# Patient Record
Sex: Male | Born: 1945 | Race: White | Hispanic: No | Marital: Married | Smoking: Former smoker
Health system: Southern US, Community
[De-identification: ages and names within clinical notes are randomized; demographics above are authoritative.]

## PROBLEM LIST (undated history)

## (undated) DIAGNOSIS — C801 Malignant (primary) neoplasm, unspecified: Secondary | ICD-10-CM

## (undated) DIAGNOSIS — M1990 Unspecified osteoarthritis, unspecified site: Secondary | ICD-10-CM

## (undated) DIAGNOSIS — K635 Polyp of colon: Secondary | ICD-10-CM

## (undated) DIAGNOSIS — I10 Essential (primary) hypertension: Secondary | ICD-10-CM

## (undated) DIAGNOSIS — I219 Acute myocardial infarction, unspecified: Secondary | ICD-10-CM

## (undated) HISTORY — PX: KNEE ARTHROSCOPY: SUR90

## (undated) HISTORY — PX: TONSILLECTOMY: SUR1361

## (undated) HISTORY — PX: MANDIBLE SURGERY: SHX707

## (undated) HISTORY — PX: HERNIA REPAIR: SHX51

---

## 2008-06-25 DIAGNOSIS — I219 Acute myocardial infarction, unspecified: Secondary | ICD-10-CM

## 2008-06-25 HISTORY — DX: Acute myocardial infarction, unspecified: I21.9

## 2015-04-13 HISTORY — PX: COLONOSCOPY: SHX174

## 2017-10-27 ENCOUNTER — Encounter (INDEPENDENT_AMBULATORY_CARE_PROVIDER_SITE_OTHER): Payer: Self-pay | Admitting: Orthopaedic Surgery

## 2017-10-27 ENCOUNTER — Ambulatory Visit (INDEPENDENT_AMBULATORY_CARE_PROVIDER_SITE_OTHER): Payer: Self-pay

## 2017-10-27 ENCOUNTER — Ambulatory Visit (INDEPENDENT_AMBULATORY_CARE_PROVIDER_SITE_OTHER): Payer: Medicare HMO | Admitting: Orthopaedic Surgery

## 2017-10-27 VITALS — Ht 68.0 in | Wt 190.0 lb

## 2017-10-27 DIAGNOSIS — M1612 Unilateral primary osteoarthritis, left hip: Secondary | ICD-10-CM

## 2017-10-27 DIAGNOSIS — M25552 Pain in left hip: Secondary | ICD-10-CM | POA: Diagnosis not present

## 2017-10-27 DIAGNOSIS — M1611 Unilateral primary osteoarthritis, right hip: Secondary | ICD-10-CM

## 2017-10-27 NOTE — Progress Notes (Signed)
Office Visit Note   Patient: Luis Elliott           Date of Birth: 1946/01/23           MRN: [id number] Visit Date: 10/27/2017              Requested by: No referring provider defined for this encounter. PCP: Thomes Dinning, MD   Assessment & Plan: Visit Diagnoses:  1. Pain in left hip   2. Unilateral primary osteoarthritis, right hip   3. Unilateral primary osteoarthritis, left hip     Plan: I do feel that based on his clinical exam and x-ray findings that he is a candidate for hip replacement surgery.  I gave him a handout that talks about anterior hip replacement surgery and I went over his x-rays on a hip model explained in detail what the surgery involves including a thorough discussion of the risks and benefits of surgery.  We talked about his intraoperative and postoperative course as well.  I do feel that he should try an intra-articular steroid injection of the left hip under direct fluoroscopy.  We can have Dr. Ernestina Patches provide this here in the office.  The patient is interested in having that done.  We will work on getting this scheduled in the now see him back sometime the first week in September to talk about things further.  All question concerns were answered and addressed.  Follow-Up Instructions: Return in about 2 months (around 12/28/2017).   Orders:  Orders Placed This Encounter  Procedures  . XR HIP UNILAT W OR W/O PELVIS 2-3 VIEWS LEFT  . Ambulatory referral to Physical Medicine Rehab   No orders of the defined types were placed in this encounter.     Procedures: No procedures performed   Clinical Data: No additional findings.   Subjective: Chief Complaint  Patient presents with  . Left Hip - Pain  The patient is a very pleasant and active 72 year old gentleman from [place] who comes in for evaluation treatment of left hip pain.  His primary orthopedic surgeon is retiring and has been told in the past that his hip is been  slowly worsening in terms of arthritis.  It is gotten to where his left hip stays stiff.  He has problems getting his shoe and sock on and off as well as crossing his leg.  He does get groin pain as well.  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  He wants to stay as active as possible.  He has stiffness in his right hip but denies any significant pain in the right hip.  Is mainly his left hip that bothers him the most.  His wife is with him today as well.  He has not had any type of intra-articular injection before that hip.  It has started detrimentally affect his activities daily living, his quality of life, his mobility.  He does not want to consider hip replacement surgery at some point.  HPI  Review of Systems He currently denies any headache, chest pain, shortness of breath, fever, chills, nausea, vomiting.  Objective: Vital Signs: Ht 5\' 8"  (1.727 m)   Wt 190 lb (86.2 kg)   BMI 28.89 kg/m   Physical Exam He is alert and oriented x3 and in no acute distress Ortho Exam Examination of his left hip shows significant limitations with internal and external rotation and significant stiffness.  He does have significant stiffness with rotation on the  right hip as well.  The left hip does show pain in the groin on rotation. Specialty Comments:  No specialty comments available.  Imaging: Xr Hip Unilat W Or W/o Pelvis 2-3 Views Left  Result Date: 10/27/2017 An AP pelvis and a lateral left hip shows significant arthritis of both hips.  There is profound superior lateral joint space narrowing of both hips as well as para-articular osteophytes both hips shows signs of femoral acetabular impingement.Marland Kitchen    PMFS History: Patient Active Problem List   Diagnosis Date Noted  . Unilateral primary osteoarthritis, left hip 10/27/2017  . Unilateral primary osteoarthritis, right hip 10/27/2017  . Pain in left hip 10/27/2017   History reviewed. No pertinent past medical history.  History  reviewed. No pertinent family history.  History reviewed. No pertinent surgical history. Social History   Occupational History  . Not on file  Tobacco Use  . Smoking status: Not on file  Substance and Sexual Activity  . Alcohol use: Not on file  . Drug use: Not on file  . Sexual activity: Not on file

## 2017-11-17 ENCOUNTER — Ambulatory Visit (INDEPENDENT_AMBULATORY_CARE_PROVIDER_SITE_OTHER): Payer: Self-pay | Admitting: Physical Medicine and Rehabilitation

## 2017-11-22 ENCOUNTER — Ambulatory Visit (INDEPENDENT_AMBULATORY_CARE_PROVIDER_SITE_OTHER): Payer: Medicare HMO | Admitting: Physical Medicine and Rehabilitation

## 2017-11-22 ENCOUNTER — Ambulatory Visit (INDEPENDENT_AMBULATORY_CARE_PROVIDER_SITE_OTHER): Payer: Self-pay

## 2017-11-22 DIAGNOSIS — M25552 Pain in left hip: Secondary | ICD-10-CM | POA: Diagnosis not present

## 2017-11-22 NOTE — Patient Instructions (Signed)

## 2017-11-22 NOTE — Progress Notes (Signed)
 .  Numeric Pain Rating Scale and Functional Assessment Average Pain 6   In the last MONTH (on 0-10 scale) has pain interfered with the following?  1. General activity like being  able to carry out your everyday physical activities such as walking, climbing stairs, carrying groceries, or moving a chair?  Rating(6)   -Dye Allergies.  

## 2017-11-22 NOTE — Progress Notes (Signed)
Luis Elliott - 72 y.o. male MRN [id number]  Date of birth: 06-11-1945  Office Visit Note: Visit Date: 11/22/2017 PCP: Thomes Dinning, MD Referred by: Thomes Dinning, MD  Subjective: Chief Complaint  Patient presents with  . Left Hip - Pain   HPI: Luis Elliott is a 72 year old gentleman that comes in today at the request of Dr. Jean Rosenthal for diagnostic and hopefully therapeutic left hip anesthetic arthrogram.  He is a very active individual playing tennis as well as pickleball.  He does get some hip and groin pain which was worse recently but has calmed down a little bit but he still has difficulty bending over to put his shoes on and some activities where he is stiff with loss of range of motion.  His wife is also present today who provides some of the history.  They discussed with me the fact that he had bilateral shoulder injections last week and we did talk about the complications with steroid medications but it would be safe to do the injection today we just would not want to do many of those back to back.  They also discussed the fact that he has had bilateral shoulder injections by his primary orthopedic doctor and they usually do these injections from an anterior approach and the last doctor that did it did it more from a posterior approach or what might of been a lateral approach.  He has not received as much relief from those injections although he is only a week out.  It sounds like it could have been more of a subacromial injection instead of intra-articular on off that would make a lot of difference and we discussed that briefly.  He did have some issue after the injection on the right arm where he was having difficulty with what appeared to be internal rotation and some temporary weakness of the shoulder.  This is totally resolved and they are following up with their regular orthopedic surgeon in terms of the shoulders.   ROS Otherwise per HPI.  Assessment &  Plan: Visit Diagnoses:  1. Pain in left hip     Plan: No additional findings.   Meds & Orders: No orders of the defined types were placed in this encounter.   Orders Placed This Encounter  Procedures  . Large Joint Inj: L hip joint  . XR C-ARM NO REPORT    Follow-up: Return if symptoms worsen or fail to improve.   Procedures: Large Joint Inj: L hip joint on 11/22/2017 11:30 AM Indications: pain and diagnostic evaluation Details: 22 G needle, anterior approach  Arthrogram: Yes  Medications: 80 mg triamcinolone acetonide 40 MG/ML; 3 mL bupivacaine 0.5 % Outcome: tolerated well, no immediate complications  Arthrogram demonstrated excellent flow of contrast throughout the joint surface without extravasation or obvious defect.  The patient had relief of symptoms during the anesthetic phase of the injection.  Procedure, treatment alternatives, risks and benefits explained, specific risks discussed. Consent was given by the patient. Immediately prior to procedure a time out was called to verify the correct patient, procedure, equipment, support staff and site/side marked as required. Patient was prepped and draped in the usual sterile fashion.      No notes on file   Clinical History: No specialty comments available.   He has no tobacco history on file. No results for input(s): HGBA1C, LABURIC in the last 8760 hours.  Objective:  VS:  HT:    WT:   BMI:     BP:  HR: bpm  TEMP: ( )  RESP:  Physical Exam  Ortho Exam Imaging: Xr C-arm No Report  Result Date: 11/22/2017 Please see Notes tab for imaging impression.   Past Medical/Family/Surgical/Social History: Medications & Allergies reviewed per EMR, new medications updated. Patient Active Problem List   Diagnosis Date Noted  . Unilateral primary osteoarthritis, left hip 10/27/2017  . Unilateral primary osteoarthritis, right hip 10/27/2017  . Pain in left hip 10/27/2017   No past medical history on file. No family  history on file. No past surgical history on file. Social History   Occupational History  . Not on file  Tobacco Use  . Smoking status: Not on file  Substance and Sexual Activity  . Alcohol use: Not on file  . Drug use: Not on file  . Sexual activity: Not on file

## 2017-11-23 MED ORDER — BUPIVACAINE HCL 0.5 % IJ SOLN
3.0000 mL | INTRAMUSCULAR | Status: AC | PRN
  Administered 2017-11-22: 3 mL via INTRA_ARTICULAR

## 2017-11-23 MED ORDER — TRIAMCINOLONE ACETONIDE 40 MG/ML IJ SUSP
80.0000 mg | INTRAMUSCULAR | Status: AC | PRN
  Administered 2017-11-22: 80 mg via INTRA_ARTICULAR

## 2017-12-13 ENCOUNTER — Ambulatory Visit (INDEPENDENT_AMBULATORY_CARE_PROVIDER_SITE_OTHER): Payer: Medicare HMO | Admitting: Orthopaedic Surgery

## 2017-12-13 ENCOUNTER — Encounter (INDEPENDENT_AMBULATORY_CARE_PROVIDER_SITE_OTHER): Payer: Self-pay | Admitting: Orthopaedic Surgery

## 2017-12-13 DIAGNOSIS — M1612 Unilateral primary osteoarthritis, left hip: Secondary | ICD-10-CM | POA: Diagnosis not present

## 2017-12-13 NOTE — Progress Notes (Addendum)
  HPI: Mr. Juenger returns today 1 month follow-up status post left hip injection 11/22/2017.  He states overall her left hip feels much better he is able to don shoes.  States that the injection took about 24 hours to work and since then return back to his normal activities.  He is having no pain in his right hip which he has osteoarthritis and also.  Physical exam: Left hip good range of motion without pain.  He is able to cross his legs.  He ambulates without any assistive device and a nonantalgic gait.  Impression: Left hip osteoarthritis  Plan: He understands the recommend cortisone injections in the hip no more frequent than every 6 months.  He will call if he needs an injection again in the hip with Dr. Ernestina Patches.  He will follow-up with [place] on an as-needed basis if his pain becomes nonresponsive to the injection then we do recommend total hip arthroplasty.  He may require an injection in the right hip also in the future.  Follow-up on as-needed basis.

## 2017-12-20 ENCOUNTER — Telehealth (INDEPENDENT_AMBULATORY_CARE_PROVIDER_SITE_OTHER): Payer: Self-pay | Admitting: Physical Medicine and Rehabilitation

## 2017-12-20 NOTE — Telephone Encounter (Signed)
See message below  Please advise

## 2017-12-20 NOTE — Telephone Encounter (Signed)
Refer question to [facility] and see what they say

## 2017-12-21 NOTE — Telephone Encounter (Signed)
No more often than  Every 6 months per Dr. Ninfa Linden .needs consider surgery if pain has returned this quickly.

## 2017-12-21 NOTE — Telephone Encounter (Signed)
Called patient to advise  °

## 2018-01-23 ENCOUNTER — Ambulatory Visit (INDEPENDENT_AMBULATORY_CARE_PROVIDER_SITE_OTHER): Payer: Medicare HMO | Admitting: Orthopaedic Surgery

## 2018-01-23 ENCOUNTER — Encounter (INDEPENDENT_AMBULATORY_CARE_PROVIDER_SITE_OTHER): Payer: Self-pay | Admitting: Orthopaedic Surgery

## 2018-01-23 DIAGNOSIS — M1612 Unilateral primary osteoarthritis, left hip: Secondary | ICD-10-CM

## 2018-01-23 NOTE — Progress Notes (Signed)
Patient is a very pleasant 72 year old gentleman who is scheduled to undergo a left total hip arthroplasty November 8.  He has had intra-articular steroid injection we did help some.  He is also had a Toradol injection in his primary care physician did place a steroid injection intramuscularly.  That is helped some.  He is ready to have the surgery.  Again these are scheduled for this we had a long and thorough discussion about the surgery involves.  He is had a handout about hip replacement surgery and went over hip model showing him things as well.  We had a long and thorough discussion about his intraoperative and postoperative course.  He is having surgery on the scalp on Wednesday of this week due to skin cancer.  We still feel comfortable with proceeding with his hip replacement on November 8.  His left hip is quite stiff on exam is well with pain on internal/external rotation.  I did look at his x-rays again showing the significance of his arthritis of his left hip.  All questions concerns were answered and addressed.  We will see him on November 8 for his surgery.

## 2018-01-26 ENCOUNTER — Telehealth (INDEPENDENT_AMBULATORY_CARE_PROVIDER_SITE_OTHER): Payer: Self-pay | Admitting: Orthopaedic Surgery

## 2018-01-26 NOTE — Telephone Encounter (Signed)
Patient called stating that he has surgery scheduled for 02/17/18 and needs to postpone surgery oer his MD Dr. Levada Dy. Patient had skin surgery and it ended up being more extensive as planned.  Patient wants a call back from scheduler so he can possibly reschedule.  Please call patient to advise. [phone]

## 2018-01-30 NOTE — Telephone Encounter (Signed)
Spoke with pt, will cancel until he calls back to reschedule once cleared from other surgeon.

## 2018-03-02 ENCOUNTER — Inpatient Hospital Stay (INDEPENDENT_AMBULATORY_CARE_PROVIDER_SITE_OTHER): Payer: Medicare HMO | Admitting: Orthopaedic Surgery

## 2018-04-19 NOTE — Progress Notes (Signed)
Please place orders in Epic as patient is being scheduled for a pre-op appointment! Thank you! 

## 2018-04-20 ENCOUNTER — Other Ambulatory Visit (INDEPENDENT_AMBULATORY_CARE_PROVIDER_SITE_OTHER): Payer: Self-pay

## 2018-04-24 NOTE — Patient Instructions (Signed)
Luis Elliott.  04/24/2018   Your procedure is scheduled on: 05-05-18    Report to [facility] Main  Entrance    Report to Admitting at 5:30 AM    Call this number if you have problems the morning of surgery [phone]    Remember: Do not eat food or drink liquids :After Midnight.      Take these medicines the morning of surgery with A SIP OF WATER:Carvedilol (Coreg)    BRUSH YOUR TEETH MORNING OF SURGERY AND RINSE YOUR MOUTH OUT, NO CHEWING GUM CANDY OR MINTS.                               You may not have any metal on your body including hair pins and              piercings  Do not wear jewelry, cologne, lotions, powders or deodorant             Men may shave face and neck.   Do not bring valuables to the hospital. [place].  Contacts, dentures or bridgework may not be worn into surgery.  Leave suitcase in the car. After surgery it may be brought to your room.     Patients discharged the day of surgery will not be allowed to drive home. IF YOU ARE HAVING SURGERY AND GOING HOME THE SAME DAY, YOU MUST HAVE AN ADULT TO DRIVE YOU HOME AND BE WITH YOU FOR 24 HOURS. YOU MAY GO HOME BY TAXI OR UBER OR ORTHERWISE, BUT AN ADULT MUST ACCOMPANY YOU HOME AND STAY WITH YOU FOR 24 HOURS.    Special Instructions: N/A              Please read over the following fact sheets you were given: _____________________________________________________________________             [place] - Preparing for Surgery Before surgery, you can play an important role.  Because skin is not sterile, your skin needs to be as free of germs as possible.  You can reduce the number of germs on your skin by washing with CHG (chlorahexidine gluconate) soap before surgery.  CHG is an antiseptic cleaner which kills germs and bonds with the skin to continue killing germs even after washing. Please DO NOT use if you have an allergy  to CHG or antibacterial soaps.  If your skin becomes reddened/irritated stop using the CHG and inform your nurse when you arrive at Short Stay. Do not shave (including legs and underarms) for at least 48 hours prior to the first CHG shower.  You may shave your face/neck. Please follow these instructions carefully:  1.  Shower with CHG Soap the night before surgery and the  morning of Surgery.  2.  If you choose to wash your hair, wash your hair first as usual with your  normal  shampoo.  3.  After you shampoo, rinse your hair and body thoroughly to remove the  shampoo.                           4.  Use CHG as you would any other liquid soap.  You can apply chg directly  to the skin and wash                       Gently with a scrungie or clean washcloth.  5.  Apply the CHG Soap to your body ONLY FROM THE NECK DOWN.   Do not use on face/ open                           Wound or open sores. Avoid contact with eyes, ears mouth and genitals (private parts).                       Wash face,  Genitals (private parts) with your normal soap.             6.  Wash thoroughly, paying special attention to the area where your surgery  will be performed.  7.  Thoroughly rinse your body with warm water from the neck down.  8.  DO NOT shower/wash with your normal soap after using and rinsing off  the CHG Soap.                9.  Pat yourself dry with a clean towel.            10.  Wear clean pajamas.            11.  Place clean sheets on your bed the night of your first shower and do not  sleep with pets. Day of Surgery : Do not apply any lotions/deodorants the morning of surgery.  Please wear clean clothes to the hospital/surgery center.  FAILURE TO FOLLOW THESE INSTRUCTIONS MAY RESULT IN THE CANCELLATION OF YOUR SURGERY PATIENT SIGNATURE_________________________________  NURSE SIGNATURE__________________________________  ________________________________________________________________________   Luis Elliott  An incentive spirometer is a tool that can help keep your lungs clear and active. This tool measures how well you are filling your lungs with each breath. Taking long deep breaths may help reverse or decrease the chance of developing breathing (pulmonary) problems (especially infection) following:  A long period of time when you are unable to move or be active. BEFORE THE PROCEDURE   If the spirometer includes an indicator to show your best effort, your nurse or respiratory therapist will set it to a desired goal.  If possible, sit up straight or lean slightly forward. Try not to slouch.  Hold the incentive spirometer in an upright position. INSTRUCTIONS FOR USE  1. Sit on the edge of your bed if possible, or sit up as far as you can in bed or on a chair. 2. Hold the incentive spirometer in an upright position. 3. Breathe out normally. 4. Place the mouthpiece in your mouth and seal your lips tightly around it. 5. Breathe in slowly and as deeply as possible, raising the piston or the ball toward the top of the column. 6. Hold your breath for 3-5 seconds or for as long as possible. Allow the piston or ball to fall to the bottom of the column. 7. Remove the mouthpiece from your mouth and breathe out normally. 8. Rest for a few seconds and repeat Steps 1 through 7 at least 10 times every 1-2 hours when you are awake. Take your time and take a few normal breaths between deep breaths. 9. The spirometer may include an indicator to show your best effort. Use the indicator as a goal to work toward during each repetition. 10. After each  set of 10 deep breaths, practice coughing to be sure your lungs are clear. If you have an incision (the cut made at the time of surgery), support your incision when coughing by placing a pillow or rolled up towels firmly against it. Once you are able to get out of bed, walk around indoors and cough well. You may stop using the incentive spirometer when  instructed by your caregiver.  RISKS AND COMPLICATIONS  Take your time so you do not get dizzy or light-headed.  If you are in pain, you may need to take or ask for pain medication before doing incentive spirometry. It is harder to take a deep breath if you are having pain. AFTER USE  Rest and breathe slowly and easily.  It can be helpful to keep track of a log of your progress. Your caregiver can provide you with a simple table to help with this. If you are using the spirometer at home, follow these instructions: [place] IF:   You are having difficultly using the spirometer.  You have trouble using the spirometer as often as instructed.  Your pain medication is not giving enough relief while using the spirometer.  You develop fever of 100.5 F (38.1 C) or higher. SEEK IMMEDIATE MEDICAL CARE IF:   You cough up bloody sputum that had not been present before.  You develop fever of 102 F (38.9 C) or greater.  You develop worsening pain at or near the incision site. MAKE SURE YOU:   Understand these instructions.  Will watch your condition.  Will get help right away if you are not doing well or get worse. Document Released: 08/09/2006 Document Revised: 06/21/2011 Document Reviewed: 10/10/2006 [facility] Patient Information 2014 [place], [place].   ________________________________________________________________________

## 2018-04-25 ENCOUNTER — Other Ambulatory Visit (INDEPENDENT_AMBULATORY_CARE_PROVIDER_SITE_OTHER): Payer: Self-pay | Admitting: Physician Assistant

## 2018-04-25 ENCOUNTER — Ambulatory Visit (INDEPENDENT_AMBULATORY_CARE_PROVIDER_SITE_OTHER): Payer: Medicare HMO | Admitting: Orthopaedic Surgery

## 2018-04-25 ENCOUNTER — Encounter (INDEPENDENT_AMBULATORY_CARE_PROVIDER_SITE_OTHER): Payer: Self-pay | Admitting: Orthopaedic Surgery

## 2018-04-25 DIAGNOSIS — M1611 Unilateral primary osteoarthritis, right hip: Secondary | ICD-10-CM | POA: Diagnosis not present

## 2018-04-25 DIAGNOSIS — M1612 Unilateral primary osteoarthritis, left hip: Secondary | ICD-10-CM

## 2018-04-25 NOTE — Progress Notes (Signed)
Office Visit Note   Patient: Luis Elliott.           Date of Birth: [birth year]           MRN: [id number] Visit Date: 04/25/2018              Requested by: Thomes Dinning, [address] [place], [place] [address] PCP: Thomes Dinning, MD   Assessment & Plan: Visit Diagnoses:  1. Unilateral primary osteoarthritis, left hip   2. Unilateral primary osteoarthritis, right hip     Plan: We went over again in detail what hip replacement surgery involves.  We had a long and thorough discussion about the risk and benefits of the surgery.  We talked about his intraoperative and postoperative course and I showed him a hip replacement model as well as real implants.  All questions concerns were answered and addressed.  I am comfortable with proceeding with the surgery given the healing that he has had with a skin cancer wound on the scalp.  We will see him in 2 weeks for his surgery.  Follow-Up Instructions: Return for 2 weeks post-op.   Orders:  No orders of the defined types were placed in this encounter.  No orders of the defined types were placed in this encounter.     Procedures: No procedures performed   Clinical Data: No additional findings.   Subjective: Chief Complaint  Patient presents with  . Left Hip - Follow-up  The patient is well-known to me.  He was originally scheduled for a left total hip arthroplasty in November of this past year but due to issues with getting skin cancer surgery to heal on his scalp, we decided to cancel the surgery appropriately so he get aggressive healing on his scalp.  Now that is taken place and he is doing better overall.  He is ready to proceed with a left total hip arthroplasty.  We have this scheduled for Friday, January 24.  He denies any other acute changes in medical status other than dealing with his scalp skin cancer.  He has Xeroform that is being treated to the wound daily and there is been no  evidence of infection.  We are comfortable with proceeding with his hip replacement at this standpoint.  He has known and well-documented significant arthritis of both his hips with the left worse than the right.  At this point his hip pain still detrimentally affects his activity living, his mobility and his quality of life.  HPI  Review of Systems He currently denies any headache, chest pain, shortness of breath, fever, chills, nausea, vomiting  Objective: Vital Signs: There were no vitals taken for this visit.  Physical Exam He is alert and oriented x3 and in no acute distress Ortho Exam Examination of his left hip shows significant stiffness with internal and external rotation and pain in the groin. Specialty Comments:  No specialty comments available.  Imaging: No results found. Previous x-rays were reviewed and show severe arthritis of both his hips with joint space narrowing, flattening of the femoral head and para-articular osteophytes with left worse than the right.  PMFS History: Patient Active Problem List   Diagnosis Date Noted  . Unilateral primary osteoarthritis, left hip 10/27/2017  . Unilateral primary osteoarthritis, right hip 10/27/2017  . Pain in left hip 10/27/2017   History reviewed. No pertinent past medical history.  History reviewed. No pertinent family history.  History reviewed. No pertinent surgical history. Social History  Occupational History  . Not on file  Tobacco Use  . Smoking status: Not on file  Substance and Sexual Activity  . Alcohol use: Not on file  . Drug use: Not on file  . Sexual activity: Not on file

## 2018-04-26 ENCOUNTER — Encounter (HOSPITAL_COMMUNITY): Payer: Self-pay

## 2018-04-26 ENCOUNTER — Encounter (HOSPITAL_COMMUNITY)
Admission: RE | Admit: 2018-04-26 | Discharge: 2018-04-26 | Disposition: A | Discharge status: Home / Self Care | Payer: Medicare HMO | Source: Ambulatory Visit | Attending: Orthopaedic Surgery | Admitting: Orthopaedic Surgery

## 2018-04-26 ENCOUNTER — Other Ambulatory Visit: Payer: Self-pay

## 2018-04-26 DIAGNOSIS — Z01818 Encounter for other preprocedural examination: Secondary | ICD-10-CM | POA: Diagnosis present

## 2018-04-26 HISTORY — DX: Unspecified osteoarthritis, unspecified site: M19.90

## 2018-04-26 HISTORY — DX: Acute myocardial infarction, unspecified: I21.9

## 2018-04-26 HISTORY — DX: Malignant (primary) neoplasm, unspecified: C80.1

## 2018-04-26 HISTORY — DX: Essential (primary) hypertension: I10

## 2018-04-26 LAB — SURGICAL PCR SCREEN
MRSA, PCR: NEGATIVE
STAPHYLOCOCCUS AUREUS: NEGATIVE

## 2018-04-26 LAB — BASIC METABOLIC PANEL
Anion gap: 10 (ref 5–15)
BUN: 17 mg/dL (ref 8–23)
CO2: 24 mmol/L (ref 22–32)
CREATININE: 0.94 mg/dL (ref 0.61–1.24)
Calcium: 9.4 mg/dL (ref 8.9–10.3)
Chloride: 105 mmol/L (ref 98–111)
GFR calc Af Amer: >60 mL/min (ref >60)
GFR calc non Af Amer: >60 mL/min (ref >60)
Glucose, Bld: 116 mg/dL — ABNORMAL HIGH (ref 70–99)
Potassium: 4.2 mmol/L (ref 3.5–5.1)
Sodium: 139 mmol/L (ref 135–145)

## 2018-04-26 LAB — CBC
HCT: 45.6 % (ref 39.0–52.0)
Hemoglobin: 15.1 g/dL (ref 13.0–17.0)
MCH: 31.1 pg (ref 26.0–34.0)
MCHC: 33.1 g/dL (ref 30.0–36.0)
MCV: 94 fL (ref 80.0–100.0)
Platelets: 181 10*3/uL (ref 150–400)
RBC: 4.85 MIL/uL (ref 4.22–5.81)
RDW: 11.9 % (ref 11.5–15.5)
WBC: 7.8 10*3/uL (ref 4.0–10.5)
nRBC: 0 % (ref 0.0–0.2)

## 2018-04-27 NOTE — Progress Notes (Signed)
Anesthesia Chart Review   Case:  [id number] Date/Time:  05/05/18 0700   Procedure:  LEFT TOTAL HIP ARTHROPLASTY ANTERIOR APPROACH (Left )   Anesthesia type:  Choice   Pre-op diagnosis:  osteoarthritis left hip   Location:  [facility] ROOM 09 / [facility]   Surgeon:  Mcarthur Rossetti, MD      DISCUSSION:72 yo former smoker (10 pack years, quit 08/10/76) with h/o CAD, MI in 2010 , HTN scheduled for above surgery on 05/05/18 with Dr. Ninfa Linden.    H/o MI in 2010 s/p LAD stenting. He is followed by Dr. Ricky Ala and was last seen 04/14/17.  Per Dr. Deatra Robinson note [facility]) pt has remained asymptomatic and active, plays tennis and other sports regularly.  He recommended 1 year follow up.    Last seen by PCP on 03/23/18, Dr. Thomes Dinning.  Per his note (Care Everywhere), "At present time patient is medically stable to under total hip replacement.  He has no symptoms or signs of angina or mycardial failure.  Conditions are well controlled and has a good functional status."    He recently underwent a MOHs surgery to scalp.  Surgery was previously cancelled to allow for the surgical wound to heal.  I was asked to examine his surgical site today during PST visit.  Wound appears to be healing well with only a small superficial scab noted, no signs of infections, no drainage, erythema, swelling, or warmth noted.  He reports he was seen by Dr. Ninfa Linden yesterday, 04/25/18 who also examined the wound and states in his not, "I am comfortable with proceeding with the surgery given the healing that he has had with a skin cancer wound on the scalp." He is ok to proceed with planned procedure and will contact Dr. Ninfa Linden if there are any changes to the wound.   Pt can proceed with planned procedure barring acute status change.  VS: BP 113/67   Pulse 71   Temp 36.9 C (Oral)   Resp 18   Ht 5\' 7"  (1.702 m)   Wt 84.1 kg   SpO2 95%   BMI 29.05 kg/m   PROVIDERS: Thomes Dinning, MD is PCP  Andrey Spearman, MD is Cardiologist  LABS: Labs reviewed: Acceptable for surgery. (all labs ordered are listed, but only abnormal results are displayed)  Labs Reviewed  BASIC METABOLIC PANEL - Abnormal; Notable for the following components:      Result Value   Glucose, Bld 116 (*)    All other components within normal limits  SURGICAL PCR SCREEN  CBC     IMAGES:   EKG: 04/26/2018 Normal sinus rhythm Left axis deviation  Abnormal ECG  CV: Ultrasound of Abdominal Aorta 12/03/16 FINDINGS: Abdominal Aorta No aneurysm identified.  Atherosclerotic irregularity noted. Maximum Diameter: Maximum diameter 2.7 cm in the proximal aorta. CONCLUSION: No evidence of abdominal aortic aneurysm. Atherosclerotic irregularity. Past Medical History:  Diagnosis Date  . Arthritis   . Cancer ([place])    Melanoma  . Hypertension   . Myocardial infarction ([place]) 06/25/2008    Past Surgical History:  Procedure Laterality Date  . COLONOSCOPY  04/2015   Pt has every 5 years  . HERNIA REPAIR    . KNEE ARTHROSCOPY     x 3 (Last surgery 2009)  . MANDIBLE SURGERY     Mole on area was removed  . TONSILLECTOMY      MEDICATIONS: . acetaminophen (TYLENOL) 325 MG tablet  . aspirin EC 81 MG tablet  .  carvedilol (COREG) 12.5 MG tablet  . docusate sodium (COLACE) 100 MG capsule  . losartan (COZAAR) 25 MG tablet  . rosuvastatin (CRESTOR) 10 MG tablet  . tamsulosin (FLOMAX) 0.4 MG CAPS capsule   No current facility-administered medications for this encounter.    Maia Plan [facility] [phone] 04/27/18 2:47 PM

## 2018-04-27 NOTE — Anesthesia Preprocedure Evaluation (Addendum)
Anesthesia Evaluation  Patient identified by MRN, date of birth, ID band Patient awake    Reviewed: Allergy & Precautions, NPO status , Patient's Chart, lab work & pertinent test results, reviewed documented beta blocker date and time   Airway Mallampati: II  TM Distance: >3 FB Neck ROM: Full    Dental no notable dental hx.    Pulmonary former smoker,    Pulmonary exam normal breath sounds clear to auscultation       Cardiovascular hypertension, Pt. on home beta blockers and Pt. on medications + CAD, + Past MI and + Cardiac Stents (x 3 in 2010)  Normal cardiovascular exam Rhythm:Regular Rate:Normal  ECG: rate 63. Normal sinus rhythm. Left axis deviation  Sees cardiologist    Neuro/Psych negative neurological ROS  negative psych ROS   GI/Hepatic negative GI ROS, Neg liver ROS,   Endo/Other  negative endocrine ROS  Renal/GU negative Renal ROS     Musculoskeletal  (+) Arthritis ,   Abdominal   Peds  Hematology HLD   Anesthesia Other Findings Osteoarthritis left hip  Reproductive/Obstetrics                           Anesthesia Physical Anesthesia Plan  ASA: III  Anesthesia Plan: Spinal   Post-op Pain Management:    Induction: Intravenous  PONV Risk Score and Plan: 1 and Propofol infusion, Treatment may vary due to age or medical condition and Dexamethasone  Airway Management Planned: Natural Airway  Additional Equipment:   Intra-op Plan:   Post-operative Plan:   Informed Consent: I have reviewed the patients History and Physical, chart, labs and discussed the procedure including the risks, benefits and alternatives for the proposed anesthesia with the patient or authorized representative who has indicated his/her understanding and acceptance.     Dental advisory given  Plan Discussed with: CRNA  Anesthesia Plan Comments: (Reviewed PST note 04/26/18, Konrad Felix,  PA-C Multi-modal: spinal and dexamethasone)      Anesthesia Quick Evaluation

## 2018-05-05 ENCOUNTER — Ambulatory Visit (HOSPITAL_COMMUNITY): Payer: Medicare HMO

## 2018-05-05 ENCOUNTER — Encounter (HOSPITAL_COMMUNITY)
Admission: RE | Disposition: A | Discharge status: Home / Self Care | Payer: Self-pay | Source: Home / Self Care | Attending: Orthopaedic Surgery

## 2018-05-05 ENCOUNTER — Observation Stay (HOSPITAL_COMMUNITY): Payer: Medicare HMO

## 2018-05-05 ENCOUNTER — Ambulatory Visit (HOSPITAL_COMMUNITY): Payer: Medicare HMO | Admitting: Physician Assistant

## 2018-05-05 ENCOUNTER — Other Ambulatory Visit: Payer: Self-pay

## 2018-05-05 ENCOUNTER — Encounter (HOSPITAL_COMMUNITY): Payer: Self-pay

## 2018-05-05 ENCOUNTER — Ambulatory Visit (HOSPITAL_COMMUNITY): Payer: Medicare HMO | Admitting: Certified Registered"

## 2018-05-05 ENCOUNTER — Observation Stay (HOSPITAL_COMMUNITY)
Admission: RE | Admit: 2018-05-05 | Discharge: 2018-05-06 | Disposition: A | Discharge status: Home / Self Care | Payer: Medicare HMO | Attending: Orthopaedic Surgery | Admitting: Orthopaedic Surgery

## 2018-05-05 DIAGNOSIS — Z888 Allergy status to other drugs, medicaments and biological substances status: Secondary | ICD-10-CM | POA: Diagnosis not present

## 2018-05-05 DIAGNOSIS — I252 Old myocardial infarction: Secondary | ICD-10-CM | POA: Insufficient documentation

## 2018-05-05 DIAGNOSIS — Z7982 Long term (current) use of aspirin: Secondary | ICD-10-CM | POA: Insufficient documentation

## 2018-05-05 DIAGNOSIS — Z87891 Personal history of nicotine dependence: Secondary | ICD-10-CM | POA: Insufficient documentation

## 2018-05-05 DIAGNOSIS — I2510 Atherosclerotic heart disease of native coronary artery without angina pectoris: Secondary | ICD-10-CM | POA: Insufficient documentation

## 2018-05-05 DIAGNOSIS — M1612 Unilateral primary osteoarthritis, left hip: Secondary | ICD-10-CM | POA: Diagnosis present

## 2018-05-05 DIAGNOSIS — I10 Essential (primary) hypertension: Secondary | ICD-10-CM | POA: Insufficient documentation

## 2018-05-05 DIAGNOSIS — Z85820 Personal history of malignant melanoma of skin: Secondary | ICD-10-CM | POA: Diagnosis not present

## 2018-05-05 DIAGNOSIS — E785 Hyperlipidemia, unspecified: Secondary | ICD-10-CM | POA: Diagnosis not present

## 2018-05-05 DIAGNOSIS — Z96642 Presence of left artificial hip joint: Secondary | ICD-10-CM

## 2018-05-05 DIAGNOSIS — M160 Bilateral primary osteoarthritis of hip: Secondary | ICD-10-CM | POA: Diagnosis not present

## 2018-05-05 DIAGNOSIS — Z419 Encounter for procedure for purposes other than remedying health state, unspecified: Secondary | ICD-10-CM

## 2018-05-05 HISTORY — PX: TOTAL HIP ARTHROPLASTY: SHX124

## 2018-05-05 SURGERY — ARTHROPLASTY, HIP, TOTAL, ANTERIOR APPROACH
Anesthesia: Spinal | Site: Hip | Laterality: Left

## 2018-05-05 MED ORDER — FENTANYL CITRATE (PF) 100 MCG/2ML IJ SOLN: 25.0000 ug | INTRAMUSCULAR | Status: DC | PRN

## 2018-05-05 MED ORDER — STERILE WATER FOR IRRIGATION IR SOLN
Status: DC | PRN
  Administered 2018-05-05: 20000 mL

## 2018-05-05 MED ORDER — ACETAMINOPHEN 325 MG PO TABS
325.0000 mg | ORAL_TABLET | Freq: Four times a day (QID) | ORAL | Status: DC | PRN
  Administered 2018-05-05 – 2018-05-06 (×2): 650 mg via ORAL
  Filled 2018-05-05 (×2): qty 2

## 2018-05-05 MED ORDER — DEXAMETHASONE SODIUM PHOSPHATE 10 MG/ML IJ SOLN
INTRAMUSCULAR | Status: AC
  Filled 2018-05-05: qty 1

## 2018-05-05 MED ORDER — TRANEXAMIC ACID-NACL 1000-0.7 MG/100ML-% IV SOLN
1000.0000 mg | INTRAVENOUS | Status: AC
  Administered 2018-05-05: 1000 mg via INTRAVENOUS
  Filled 2018-05-05: qty 100

## 2018-05-05 MED ORDER — FENTANYL CITRATE (PF) 100 MCG/2ML IJ SOLN
INTRAMUSCULAR | Status: AC
  Filled 2018-05-05: qty 2

## 2018-05-05 MED ORDER — PHENYLEPHRINE 40 MCG/ML (10ML) SYRINGE FOR IV PUSH (FOR BLOOD PRESSURE SUPPORT)
PREFILLED_SYRINGE | INTRAVENOUS | Status: DC | PRN
  Administered 2018-05-05 (×8): 40 ug via INTRAVENOUS
  Administered 2018-05-05: 80 ug via INTRAVENOUS

## 2018-05-05 MED ORDER — ONDANSETRON HCL 4 MG/2ML IJ SOLN: 4.0000 mg | Freq: Four times a day (QID) | INTRAMUSCULAR | Status: DC | PRN

## 2018-05-05 MED ORDER — MIDAZOLAM HCL 2 MG/2ML IJ SOLN
INTRAMUSCULAR | Status: AC
  Filled 2018-05-05: qty 2

## 2018-05-05 MED ORDER — METOCLOPRAMIDE HCL 5 MG/ML IJ SOLN
5.0000 mg | Freq: Three times a day (TID) | INTRAMUSCULAR | Status: DC | PRN
  Administered 2018-05-05: 10 mg via INTRAVENOUS
  Filled 2018-05-05: qty 2

## 2018-05-05 MED ORDER — SODIUM CHLORIDE 0.9 % IR SOLN
Status: DC | PRN
  Administered 2018-05-05: 1000 mL

## 2018-05-05 MED ORDER — PROPOFOL 500 MG/50ML IV EMUL
INTRAVENOUS | Status: DC | PRN
  Administered 2018-05-05: 40 ug/kg/min via INTRAVENOUS

## 2018-05-05 MED ORDER — METOCLOPRAMIDE HCL 5 MG PO TABS: 5.0000 mg | ORAL_TABLET | Freq: Three times a day (TID) | ORAL | Status: DC | PRN

## 2018-05-05 MED ORDER — ONDANSETRON HCL 4 MG PO TABS: 4.0000 mg | ORAL_TABLET | Freq: Four times a day (QID) | ORAL | Status: DC | PRN

## 2018-05-05 MED ORDER — HYDROMORPHONE HCL 1 MG/ML IJ SOLN: 0.5000 mg | INTRAMUSCULAR | Status: DC | PRN

## 2018-05-05 MED ORDER — ONDANSETRON HCL 4 MG/2ML IJ SOLN: 4.0000 mg | Freq: Once | INTRAMUSCULAR | Status: DC | PRN

## 2018-05-05 MED ORDER — ROSUVASTATIN CALCIUM 10 MG PO TABS
10.0000 mg | ORAL_TABLET | Freq: Every evening | ORAL | Status: DC
  Administered 2018-05-05: 10 mg via ORAL
  Filled 2018-05-05: qty 1

## 2018-05-05 MED ORDER — ONDANSETRON HCL 4 MG/2ML IJ SOLN
INTRAMUSCULAR | Status: DC | PRN
  Administered 2018-05-05: 4 mg via INTRAVENOUS

## 2018-05-05 MED ORDER — METHOCARBAMOL 500 MG PO TABS
500.0000 mg | ORAL_TABLET | Freq: Four times a day (QID) | ORAL | Status: DC | PRN
  Administered 2018-05-05 – 2018-05-06 (×3): 500 mg via ORAL
  Filled 2018-05-05 (×3): qty 1

## 2018-05-05 MED ORDER — OXYCODONE HCL 5 MG PO TABS
5.0000 mg | ORAL_TABLET | ORAL | Status: DC | PRN
  Administered 2018-05-05: 5 mg via ORAL
  Administered 2018-05-05: 10 mg via ORAL
  Administered 2018-05-05: 5 mg via ORAL
  Administered 2018-05-06: 10 mg via ORAL
  Filled 2018-05-05: qty 1
  Filled 2018-05-05 (×2): qty 2
  Filled 2018-05-05: qty 1
  Filled 2018-05-05 (×2): qty 2

## 2018-05-05 MED ORDER — PANTOPRAZOLE SODIUM 40 MG PO TBEC
40.0000 mg | DELAYED_RELEASE_TABLET | Freq: Every day | ORAL | Status: DC
  Administered 2018-05-05 – 2018-05-06 (×2): 40 mg via ORAL
  Filled 2018-05-05 (×2): qty 1

## 2018-05-05 MED ORDER — PHENOL 1.4 % MT LIQD
1.0000 | OROMUCOSAL | Status: DC | PRN
  Filled 2018-05-05: qty 177

## 2018-05-05 MED ORDER — PHENYLEPHRINE 40 MCG/ML (10ML) SYRINGE FOR IV PUSH (FOR BLOOD PRESSURE SUPPORT)
PREFILLED_SYRINGE | INTRAVENOUS | Status: AC
  Filled 2018-05-05: qty 10

## 2018-05-05 MED ORDER — ONDANSETRON HCL 4 MG/2ML IJ SOLN
INTRAMUSCULAR | Status: AC
  Filled 2018-05-05: qty 2

## 2018-05-05 MED ORDER — DOCUSATE SODIUM 100 MG PO CAPS
100.0000 mg | ORAL_CAPSULE | Freq: Two times a day (BID) | ORAL | Status: DC
  Administered 2018-05-05 – 2018-05-06 (×2): 100 mg via ORAL
  Filled 2018-05-05 (×2): qty 1

## 2018-05-05 MED ORDER — MENTHOL 3 MG MT LOZG: 1.0000 | LOZENGE | OROMUCOSAL | Status: DC | PRN

## 2018-05-05 MED ORDER — ALUM & MAG HYDROXIDE-SIMETH 200-200-20 MG/5ML PO SUSP: 30.0000 mL | ORAL | Status: DC | PRN

## 2018-05-05 MED ORDER — LOSARTAN POTASSIUM 25 MG PO TABS
25.0000 mg | ORAL_TABLET | Freq: Every day | ORAL | Status: DC
  Administered 2018-05-05 – 2018-05-06 (×2): 25 mg via ORAL
  Filled 2018-05-05 (×2): qty 1

## 2018-05-05 MED ORDER — DEXAMETHASONE SODIUM PHOSPHATE 10 MG/ML IJ SOLN
INTRAMUSCULAR | Status: DC | PRN
  Administered 2018-05-05: 10 mg via INTRAVENOUS

## 2018-05-05 MED ORDER — SODIUM CHLORIDE 0.9 % IV SOLN
INTRAVENOUS | Status: DC
  Administered 2018-05-05: via INTRAVENOUS
  Administered 2018-05-05: 75 mL/h via INTRAVENOUS

## 2018-05-05 MED ORDER — SODIUM CHLORIDE 0.9 % IV SOLN
INTRAVENOUS | Status: DC | PRN
  Administered 2018-05-05: 50 ug/min via INTRAVENOUS

## 2018-05-05 MED ORDER — CHLORHEXIDINE GLUCONATE 4 % EX LIQD: 60.0000 mL | Freq: Once | CUTANEOUS | Status: DC

## 2018-05-05 MED ORDER — PHENYLEPHRINE HCL 10 MG/ML IJ SOLN
INTRAMUSCULAR | Status: AC
  Filled 2018-05-05: qty 1

## 2018-05-05 MED ORDER — TAMSULOSIN HCL 0.4 MG PO CAPS
0.4000 mg | ORAL_CAPSULE | Freq: Every day | ORAL | Status: DC
  Administered 2018-05-05 – 2018-05-06 (×2): 0.4 mg via ORAL
  Filled 2018-05-05 (×2): qty 1

## 2018-05-05 MED ORDER — FENTANYL CITRATE (PF) 100 MCG/2ML IJ SOLN
INTRAMUSCULAR | Status: DC | PRN
  Administered 2018-05-05: 50 ug via INTRAVENOUS
  Administered 2018-05-05: 25 ug via INTRAVENOUS

## 2018-05-05 MED ORDER — MIDAZOLAM HCL 2 MG/2ML IJ SOLN
INTRAMUSCULAR | Status: DC | PRN
  Administered 2018-05-05: 2 mg via INTRAVENOUS

## 2018-05-05 MED ORDER — CEFAZOLIN SODIUM-DEXTROSE 1-4 GM/50ML-% IV SOLN
1.0000 g | Freq: Four times a day (QID) | INTRAVENOUS | Status: AC
  Administered 2018-05-05 (×2): 1 g via INTRAVENOUS
  Filled 2018-05-05 (×2): qty 50

## 2018-05-05 MED ORDER — LACTATED RINGERS IV SOLN
INTRAVENOUS | Status: DC
  Administered 2018-05-05: 1000 mL via INTRAVENOUS

## 2018-05-05 MED ORDER — BUPIVACAINE IN DEXTROSE 0.75-8.25 % IT SOLN
INTRATHECAL | Status: DC | PRN
  Administered 2018-05-05: 2 mL via INTRATHECAL

## 2018-05-05 MED ORDER — METHOCARBAMOL 500 MG IVPB - SIMPLE MED
500.0000 mg | Freq: Four times a day (QID) | INTRAVENOUS | Status: DC | PRN
  Filled 2018-05-05: qty 50

## 2018-05-05 MED ORDER — CEFAZOLIN SODIUM-DEXTROSE 2-4 GM/100ML-% IV SOLN
2.0000 g | INTRAVENOUS | Status: AC
  Administered 2018-05-05: 2 g via INTRAVENOUS
  Filled 2018-05-05: qty 100

## 2018-05-05 MED ORDER — DIPHENHYDRAMINE HCL 12.5 MG/5ML PO ELIX: 12.5000 mg | ORAL_SOLUTION | ORAL | Status: DC | PRN

## 2018-05-05 MED ORDER — CARVEDILOL 12.5 MG PO TABS
12.5000 mg | ORAL_TABLET | Freq: Two times a day (BID) | ORAL | Status: DC
  Administered 2018-05-05 – 2018-05-06 (×2): 12.5 mg via ORAL
  Filled 2018-05-05 (×2): qty 1

## 2018-05-05 MED ORDER — PROPOFOL 10 MG/ML IV BOLUS
INTRAVENOUS | Status: AC
  Filled 2018-05-05: qty 40

## 2018-05-05 MED ORDER — ASPIRIN 81 MG PO CHEW
81.0000 mg | CHEWABLE_TABLET | Freq: Two times a day (BID) | ORAL | Status: DC
  Administered 2018-05-05 – 2018-05-06 (×2): 81 mg via ORAL
  Filled 2018-05-05 (×2): qty 1

## 2018-05-05 MED ORDER — OXYCODONE HCL 5 MG PO TABS
10.0000 mg | ORAL_TABLET | ORAL | Status: DC | PRN
  Administered 2018-05-05 – 2018-05-06 (×2): 10 mg via ORAL
  Administered 2018-05-06: 15 mg via ORAL
  Filled 2018-05-05: qty 3

## 2018-05-05 MED ORDER — LIDOCAINE 2% (20 MG/ML) 5 ML SYRINGE
INTRAMUSCULAR | Status: AC
  Filled 2018-05-05: qty 5

## 2018-05-05 MED ORDER — PROPOFOL 10 MG/ML IV BOLUS
INTRAVENOUS | Status: AC
  Filled 2018-05-05: qty 20

## 2018-05-05 SURGICAL SUPPLY — 48 items
BAG ZIPLOCK 12X15 (MISCELLANEOUS) IMPLANT
BENZOIN TINCTURE PRP APPL 2/3 (GAUZE/BANDAGES/DRESSINGS) IMPLANT
BLADE SAW SGTL 18X1.27X75 (BLADE) ×2 IMPLANT
BLADE SAW SGTL 18X1.27X75MM (BLADE) ×1
BLADE SURG SZ10 CARB STEEL (BLADE) ×6 IMPLANT
CLOSURE WOUND 1/2 X4 (GAUZE/BANDAGES/DRESSINGS)
COVER PERINEAL POST (MISCELLANEOUS) ×3 IMPLANT
COVER SURGICAL LIGHT HANDLE (MISCELLANEOUS) ×3 IMPLANT
COVER WAND RF STERILE (DRAPES) ×3 IMPLANT
DRAPE STERI IOBAN 125X83 (DRAPES) ×3 IMPLANT
DRAPE U-SHAPE 47X51 STRL (DRAPES) ×6 IMPLANT
DRSG AQUACEL AG ADV 3.5X10 (GAUZE/BANDAGES/DRESSINGS) ×3 IMPLANT
DURAPREP 26ML APPLICATOR (WOUND CARE) ×3 IMPLANT
ELECT REM PT RETURN 15FT ADLT (MISCELLANEOUS) ×3 IMPLANT
GAUZE XEROFORM 1X8 LF (GAUZE/BANDAGES/DRESSINGS) ×3 IMPLANT
GLOVE BIO SURGEON STRL SZ 6.5 (GLOVE) ×4 IMPLANT
GLOVE BIO SURGEON STRL SZ7.5 (GLOVE) ×3 IMPLANT
GLOVE BIO SURGEONS STRL SZ 6.5 (GLOVE) ×2
GLOVE BIOGEL PI IND STRL 6.5 (GLOVE) ×1 IMPLANT
GLOVE BIOGEL PI IND STRL 7.0 (GLOVE) ×2 IMPLANT
GLOVE BIOGEL PI IND STRL 8 (GLOVE) ×2 IMPLANT
GLOVE BIOGEL PI INDICATOR 6.5 (GLOVE) ×2
GLOVE BIOGEL PI INDICATOR 7.0 (GLOVE) ×4
GLOVE BIOGEL PI INDICATOR 8 (GLOVE) ×4
GLOVE ECLIPSE 7.0 STRL STRAW (GLOVE) ×3 IMPLANT
GLOVE ECLIPSE 8.0 STRL XLNG CF (GLOVE) ×3 IMPLANT
GOWN SPEC L4 XLG W/TWL (GOWN DISPOSABLE) ×3 IMPLANT
GOWN STRL REUS W/TWL LRG LVL3 (GOWN DISPOSABLE) ×3 IMPLANT
GOWN STRL REUS W/TWL XL LVL3 (GOWN DISPOSABLE) ×6 IMPLANT
HANDPIECE INTERPULSE COAX TIP (DISPOSABLE) ×2
HEAD M SROM 36MM PLUS 1.5 (Hips) ×1 IMPLANT
HOLDER FOLEY CATH W/STRAP (MISCELLANEOUS) ×3 IMPLANT
LINER ACETAB NEUTRAL 36ID 520D (Liner) ×3 IMPLANT
PACK ANTERIOR HIP CUSTOM (KITS) ×3 IMPLANT
PIN SECTOR W/GRIP ACE CUP 52MM (Hips) ×3 IMPLANT
SET HNDPC FAN SPRY TIP SCT (DISPOSABLE) ×1 IMPLANT
SROM M HEAD 36MM PLUS 1.5 (Hips) ×3 IMPLANT
STAPLER VISISTAT 35W (STAPLE) ×3 IMPLANT
STEM CORAIL KA12 (Stem) ×3 IMPLANT
STRIP CLOSURE SKIN 1/2X4 (GAUZE/BANDAGES/DRESSINGS) IMPLANT
SUT ETHIBOND NAB CT1 #1 30IN (SUTURE) ×3 IMPLANT
SUT MNCRL AB 4-0 PS2 18 (SUTURE) IMPLANT
SUT VIC AB 0 CT1 36 (SUTURE) ×3 IMPLANT
SUT VIC AB 1 CT1 36 (SUTURE) ×3 IMPLANT
SUT VIC AB 2-0 CT1 27 (SUTURE) ×4
SUT VIC AB 2-0 CT1 TAPERPNT 27 (SUTURE) ×2 IMPLANT
TRAY FOLEY MTR SLVR 16FR STAT (SET/KITS/TRAYS/PACK) ×3 IMPLANT
YANKAUER SUCT BULB TIP 10FT TU (MISCELLANEOUS) ×3 IMPLANT

## 2018-05-05 NOTE — Anesthesia Postprocedure Evaluation (Signed)
Anesthesia Post Note  Patient: Luis Elliott.  Procedure(s) Performed: LEFT TOTAL HIP ARTHROPLASTY ANTERIOR APPROACH (Left Hip)     Patient location during evaluation: PACU Anesthesia Type: Spinal Level of consciousness: oriented and awake and alert Pain management: pain level controlled Vital Signs Assessment: post-procedure vital signs reviewed and stable Respiratory status: spontaneous breathing, respiratory function stable and patient connected to nasal cannula oxygen Cardiovascular status: blood pressure returned to baseline and stable Postop Assessment: no headache, no backache, no apparent nausea or vomiting and spinal receding Anesthetic complications: no    Last Vitals:  Vitals:   05/05/18 1212 05/05/18 1307  BP: 125/78 132/82  Pulse: 66 86  Resp: 14 14  Temp: (!) 36.3 C (!) 36.4 C  SpO2: 97% 99%    Last Pain:  Vitals:   05/05/18 1519  TempSrc:   PainSc: 7                  Ryan P Ellender

## 2018-05-05 NOTE — Anesthesia Procedure Notes (Signed)
Date/Time: 05/05/2018 7:18 AM Performed by: Eben Burow, CRNA Pre-anesthesia Checklist: Patient identified, Emergency Drugs available, Suction available, Patient being monitored and Timeout performed Patient Re-evaluated:Patient Re-evaluated prior to induction Oxygen Delivery Method: Simple face mask Dental Injury: Teeth and Oropharynx as per pre-operative assessment

## 2018-05-05 NOTE — Transfer of Care (Signed)
Immediate Anesthesia Transfer of Care Note  Patient: Luis Elliott.  Procedure(s) Performed: LEFT TOTAL HIP ARTHROPLASTY ANTERIOR APPROACH (Left Hip)  Patient Location: PACU  Anesthesia Type:Spinal  Level of Consciousness: awake and patient cooperative  Airway & Oxygen Therapy: Patient Spontanous Breathing and Patient connected to face mask oxygen  Post-op Assessment: Report given to RN and Post -op Vital signs reviewed and stable  Post vital signs: Reviewed and stable  Last Vitals:  Vitals Value Taken Time  BP 104/68 05/05/2018  8:43 AM  Temp    Pulse 79 05/05/2018  8:45 AM  Resp 13 05/05/2018  8:45 AM  SpO2 100 % 05/05/2018  8:45 AM  Vitals shown include unvalidated device data.  Last Pain:  Vitals:   05/05/18 0547  TempSrc: Oral      Patients Stated Pain Goal: 4 (21/11/73 5670)  Complications: No apparent anesthesia complications

## 2018-05-05 NOTE — Anesthesia Procedure Notes (Signed)
Spinal  Patient location during procedure: OR Start time: 05/05/2018 7:20 AM End time: 05/05/2018 7:30 AM Staffing Anesthesiologist: Murvin Natal, MD Performed: anesthesiologist  Preanesthetic Checklist Completed: patient identified, surgical consent, pre-op evaluation, timeout performed, IV checked, risks and benefits discussed and monitors and equipment checked Spinal Block Patient position: sitting Prep: DuraPrep Patient monitoring: cardiac monitor, continuous pulse ox and blood pressure Approach: left paramedian Location: L4-5 Injection technique: single-shot Needle Needle type: Quincke  Needle gauge: 22 G Needle length: 9 cm Assessment Sensory level: T10 Additional Notes Functioning IV was confirmed and monitors were applied. Sterile prep and drape, including hand hygiene and sterile gloves were used. The patient was positioned and the spine was prepped. The skin was anesthetized with lidocaine. Dural puncture felt with 24g Pencan, however unable to obtain CSF.  Free flow of clear CSF was obtained prior to injecting local anesthetic into the CSF.  The spinal needle aspirated freely following injection.  The needle was carefully withdrawn.  The patient tolerated the procedure well.

## 2018-05-05 NOTE — H&P (Signed)
TOTAL HIP ADMISSION H&P  Patient is admitted for left total hip arthroplasty.  Subjective:  Chief Complaint: left hip pain  HPI: Luis Haff., 73 y.o. male, has a history of pain and functional disability in the left hip(s) due to arthritis and patient has failed non-surgical conservative treatments for greater than 12 weeks to include NSAID's and/or analgesics, corticosteriod injections, flexibility and strengthening excercises and activity modification.  Onset of symptoms was gradual starting 3 years ago with gradually worsening course since that time.The patient noted no past surgery on the left hip(s).  Patient currently rates pain in the left hip at 10 out of 10 with activity. Patient has night pain, worsening of pain with activity and weight bearing, trendelenberg gait, pain that interfers with activities of daily living, pain with passive range of motion and crepitus. Patient has evidence of subchondral sclerosis, periarticular osteophytes and joint space narrowing by imaging studies. This condition presents safety issues increasing the risk of falls.  There is no current active infection.  Patient Active Problem List   Diagnosis Date Noted  . Unilateral primary osteoarthritis, left hip 10/27/2017  . Unilateral primary osteoarthritis, right hip 10/27/2017  . Pain in left hip 10/27/2017   Past Medical History:  Diagnosis Date  . Arthritis   . Cancer ([place])    Melanoma  . Hypertension   . Myocardial infarction ([place]) 06/25/2008    Past Surgical History:  Procedure Laterality Date  . COLONOSCOPY  04/2015   Pt has every 5 years  . HERNIA REPAIR    . KNEE ARTHROSCOPY     x 3 (Last surgery 2009)  . MANDIBLE SURGERY     Mole on area was removed  . TONSILLECTOMY      Current Facility-Administered Medications  Medication Dose Route Frequency Provider Last Rate Last Dose  . ceFAZolin (ANCEF) IVPB 2g/100 mL premix  2 g Intravenous On Call to OR Pete Pelt, PA-C       . chlorhexidine (HIBICLENS) 4 % liquid 4 application  60 mL Topical Once Pete Pelt, PA-C      . lactated ringers infusion   Intravenous Continuous Myrtie Soman, MD 75 mL/hr at 05/05/18 0615    . tranexamic acid (CYKLOKAPRON) IVPB 1,000 mg  1,000 mg Intravenous To OR Pete Pelt, PA-C       Allergies  Allergen Reactions  . Niacin Hives    Social History   Tobacco Use  . Smoking status: Former Smoker    Packs/day: 2.00    Years: 5.00    Pack years: 10.00    Types: Cigarettes    Last attempt to quit: 08/10/1976    Years since quitting: 41.7  . Smokeless tobacco: Never Used  Substance Use Topics  . Alcohol use: Yes    Alcohol/week: 1.0 standard drinks    Types: 1 Cans of beer per week    Comment: weekly    History reviewed. No pertinent family history.   Review of Systems  Musculoskeletal: Positive for joint pain.  All other systems reviewed and are negative.   Objective:  Physical Exam  Constitutional: He is oriented to person, place, and time. He appears well-developed and well-nourished.  HENT:  Head: Normocephalic and atraumatic.  Eyes: Pupils are equal, round, and reactive to light.  Neck: Normal range of motion.  Cardiovascular: Normal rate.  Respiratory: Effort normal.  GI: Soft.  Musculoskeletal:     Left hip: He exhibits decreased range of motion, decreased strength, tenderness and  bony tenderness.  Neurological: He is alert and oriented to person, place, and time.  Skin: Skin is warm and dry.  Psychiatric: He has a normal mood and affect.    Vital signs in last 24 hours: Temp:  [98.2 F (36.8 C)] 98.2 F (36.8 C) (01/24 0547) Pulse Rate:  [90] 90 (01/24 0547) Resp:  [14] 14 (01/24 0547) BP: (144)/(96) 144/96 (01/24 0547) SpO2:  [95 %] 95 % (01/24 0547) Weight:  [83.9 kg] 83.9 kg (01/24 0601)  Labs:   Estimated body mass index is 28.13 kg/m as calculated from the following:   Height as of this encounter: 5\' 8"  (1.727 m).   Weight as  of this encounter: 83.9 kg.   Imaging Review Plain radiographs demonstrate severe degenerative joint disease of the left hip(s). The bone quality appears to be excellent for age and reported activity level.    Preoperative templating of the joint replacement has been completed, documented, and submitted to the Operating Room personnel in order to optimize intra-operative equipment management.     Assessment/Plan:  End stage arthritis, left hip(s)  The patient history, physical examination, clinical judgement of the provider and imaging studies are consistent with end stage degenerative joint disease of the left hip(s) and total hip arthroplasty is deemed medically necessary. The treatment options including medical management, injection therapy, arthroscopy and arthroplasty were discussed at length. The risks and benefits of total hip arthroplasty were presented and reviewed. The risks due to aseptic loosening, infection, stiffness, dislocation/subluxation,  thromboembolic complications and other imponderables were discussed.  The patient acknowledged the explanation, agreed to proceed with the plan and consent was signed. Patient is being admitted for inpatient treatment for surgery, pain control, PT, OT, prophylactic antibiotics, VTE prophylaxis, progressive ambulation and ADL's and discharge planning.The patient is planning to be discharged home with home health services

## 2018-05-05 NOTE — Plan of Care (Signed)
Pt is stable. Pain mangment in progress, effective. Plan of care reviewed.

## 2018-05-05 NOTE — Brief Op Note (Signed)
05/05/2018  8:27 AM  PATIENT:  Luis Elliott.  73 y.o. male  PRE-OPERATIVE DIAGNOSIS:  osteoarthritis left hip  POST-OPERATIVE DIAGNOSIS:  osteoarthritis left hip  PROCEDURE:  Procedure(s): LEFT TOTAL HIP ARTHROPLASTY ANTERIOR APPROACH (Left)  SURGEON:  Surgeon(s) and Role:    Mcarthur Rossetti, MD - Primary  PHYSICIAN ASSISTANT: Benita Stabile, PA-C  ANESTHESIA:   spinal  EBL:  100 mL   COUNTS:  YES  TOURNIQUET:  * No tourniquets in log *  DICTATION: .Other Dictation: Dictation Number [id number]  PLAN OF CARE: Admit to inpatient   PATIENT DISPOSITION:  PACU - hemodynamically stable.   Delay start of Pharmacological VTE agent (>24hrs) due to surgical blood loss or risk of bleeding: no

## 2018-05-05 NOTE — Evaluation (Signed)
Physical Therapy Evaluation Patient Details Name: Luis Elliott. MRN: [id number] DOB: 1946-03-11 Today's Date: 05/05/2018   History of Present Illness  Pt s/p L THR adn with hx of MI  Clinical Impression  Pt s/p L THR and presents with decreased L LE strength/ROM and post op pain limiting functional mobility.  Pt should progress to dc home with family assist.    Follow Up Recommendations Follow surgeon's recommendation for DC plan and follow-up therapies    Equipment Recommendations  Rolling walker with 5" wheels    Recommendations for Other Services       Precautions / Restrictions Precautions Precautions: Fall Restrictions Weight Bearing Restrictions: No Other Position/Activity Restrictions: WBAT      Mobility  Bed Mobility Overal bed mobility: Needs Assistance Bed Mobility: Supine to Sit     Supine to sit: Min assist;Mod assist     General bed mobility comments: cues for sequence and use of R LE to self assist  Transfers Overall transfer level: Needs assistance Equipment used: Rolling walker (2 wheeled) Transfers: Sit to/from Stand Sit to Stand: Min assist;Mod assist         General transfer comment: cues for LE management and use of UEs to self assist  Ambulation/Gait Ambulation/Gait assistance: Min assist;+2 safety/equipment Gait Distance (Feet): 10 Feet Assistive device: Rolling walker (2 wheeled) Gait Pattern/deviations: Step-to pattern;Decreased step length - right;Decreased step length - left;Shuffle;Trunk flexed Gait velocity: decr   General Gait Details: cues for sequence, posture and position from [employer]            Wheelchair Mobility    Modified Rankin (Stroke Patients Only)       Balance Overall balance assessment: Needs assistance Sitting-balance support: No upper extremity supported;Feet supported Sitting balance-Leahy Scale: Good       Standing balance-Leahy Scale: Fair                                Pertinent Vitals/Pain Pain Assessment: 0-10 Pain Score: 3  Pain Location: L hip Pain Descriptors / Indicators: Burning Pain Intervention(s): Limited activity within patient's tolerance;Monitored during session;Premedicated before session;Ice applied    Home Living Family/patient expects to be discharged to:: Private residence Living Arrangements: Spouse/significant other Available Help at Discharge: Family Type of Home: House Home Access: Stairs to enter Entrance Stairs-Rails: Right Entrance Stairs-Number of Steps: 2 Home Layout: Two level Home Equipment: None      Prior Function Level of Independence: Independent               Hand Dominance        Extremity/Trunk Assessment   Upper Extremity Assessment Upper Extremity Assessment: Overall WFL for tasks assessed    Lower Extremity Assessment Lower Extremity Assessment: LLE deficits/detail    Cervical / Trunk Assessment Cervical / Trunk Assessment: Normal  Communication   Communication: No difficulties  Cognition Arousal/Alertness: Awake/alert Behavior During Therapy: WFL for tasks assessed/performed;Anxious Overall Cognitive Status: Within Functional Limits for tasks assessed                                        General Comments      Exercises Total Joint Exercises Ankle Circles/Pumps: AROM;Both;15 reps;Supine   Assessment/Plan    PT Assessment Patient needs continued PT services  PT Problem List Decreased strength;Decreased range of motion;Decreased activity tolerance;Decreased mobility;Decreased  knowledge of use of DME;Decreased balance;Pain       PT Treatment Interventions DME instruction;Gait training;Stair training;Functional mobility training;Therapeutic activities;Therapeutic exercise;Patient/family education    PT Goals (Current goals can be found in the Care Plan section)  Acute Rehab PT Goals Patient Stated Goal: get back to playing pickle ball PT Goal  Formulation: With patient Time For Goal Achievement: 05/19/18 Potential to Achieve Goals: Good    Frequency 7X/week   Barriers to discharge        Co-evaluation               AM-PAC PT "6 Clicks" Mobility  Outcome Measure Help needed turning from your back to your side while in a flat bed without using bedrails?: A Lot Help needed moving from lying on your back to sitting on the side of a flat bed without using bedrails?: A Little Help needed moving to and from a bed to a chair (including a wheelchair)?: None Help needed standing up from a chair using your arms (e.g., wheelchair or bedside chair)?: A Little Help needed to walk in hospital room?: A Little Help needed climbing 3-5 steps with a railing? : A Lot 6 Click Score: 17    End of Session Equipment Utilized During Treatment: Gait belt Activity Tolerance: Patient limited by fatigue Patient left: in chair;with call bell/phone within reach;with chair alarm set;with family/visitor present Nurse Communication: Mobility status PT Visit Diagnosis: Difficulty in walking, not elsewhere classified (R26.2)    Time: 8938-1017 PT Time Calculation (min) (ACUTE ONLY): 24 min   Charges:   PT Evaluation $PT Eval Low Complexity: 1 Low PT Treatments $Gait Training: 8-22 mins        [place] Pager [phone] Office [phone]   Mcclellan Demarais 05/05/2018, 4:49 PM

## 2018-05-06 DIAGNOSIS — M160 Bilateral primary osteoarthritis of hip: Secondary | ICD-10-CM | POA: Diagnosis not present

## 2018-05-06 LAB — BASIC METABOLIC PANEL
Anion gap: 7 (ref 5–15)
BUN: 12 mg/dL (ref 8–23)
CO2: 22 mmol/L (ref 22–32)
Calcium: 8.9 mg/dL (ref 8.9–10.3)
Chloride: 108 mmol/L (ref 98–111)
Creatinine, Ser: 0.79 mg/dL (ref 0.61–1.24)
GFR calc Af Amer: >60 mL/min (ref >60)
GFR calc non Af Amer: >60 mL/min (ref >60)
Glucose, Bld: 152 mg/dL — ABNORMAL HIGH (ref 70–99)
Potassium: 3.9 mmol/L (ref 3.5–5.1)
Sodium: 137 mmol/L (ref 135–145)

## 2018-05-06 LAB — CBC
HCT: 41 % (ref 39.0–52.0)
Hemoglobin: 13.6 g/dL (ref 13.0–17.0)
MCH: 30.8 pg (ref 26.0–34.0)
MCHC: 33.2 g/dL (ref 30.0–36.0)
MCV: 92.8 fL (ref 80.0–100.0)
Platelets: 160 10*3/uL (ref 150–400)
RBC: 4.42 MIL/uL (ref 4.22–5.81)
RDW: 11.9 % (ref 11.5–15.5)
WBC: 14.8 10*3/uL — ABNORMAL HIGH (ref 4.0–10.5)
nRBC: 0 % (ref 0.0–0.2)

## 2018-05-06 MED ORDER — ASPIRIN 81 MG PO CHEW: 81.0000 mg | CHEWABLE_TABLET | Freq: Two times a day (BID) | ORAL | 0 refills | Status: DC

## 2018-05-06 MED ORDER — METHOCARBAMOL 500 MG PO TABS: 500.0000 mg | ORAL_TABLET | Freq: Three times a day (TID) | ORAL | 0 refills | Status: DC | PRN

## 2018-05-06 MED ORDER — OXYCODONE HCL 10 MG PO TABS: 10.0000 mg | ORAL_TABLET | ORAL | 0 refills | Status: DC | PRN

## 2018-05-06 NOTE — Progress Notes (Signed)
Physical Therapy Treatment Patient Details Name: Luis Elliott. MRN: [id number] DOB: 07-29-1945 Today's Date: 05/06/2018    History of Present Illness Pt s/p L THR adn with hx of MI    PT Comments    Marked improvement in activity tolerance and progressing well with mobility.  Pt hopes to dc home this date.   Follow Up Recommendations  Follow surgeon's recommendation for DC plan and follow-up therapies     Equipment Recommendations  Rolling walker with 5" wheels    Recommendations for Other Services       Precautions / Restrictions Precautions Precautions: Fall Restrictions Weight Bearing Restrictions: No Other Position/Activity Restrictions: WBAT    Mobility  Bed Mobility               General bed mobility comments: Pt up in chair and requests back to same  Transfers Overall transfer level: Needs assistance Equipment used: Rolling walker (2 wheeled) Transfers: Sit to/from Stand Sit to Stand: Min guard         General transfer comment: cues for LE management and use of UEs to self assist  Ambulation/Gait Ambulation/Gait assistance: Min guard Gait Distance (Feet): 120 Feet Assistive device: Rolling walker (2 wheeled) Gait Pattern/deviations: Step-to pattern;Decreased step length - right;Decreased step length - left;Shuffle;Trunk flexed Gait velocity: decr   General Gait Details: cues for sequence, posture and position from [employer]             Wheelchair Mobility    Modified Rankin (Stroke Patients Only)       Balance Overall balance assessment: Mild deficits observed, not formally tested                                          Cognition Arousal/Alertness: Awake/alert Behavior During Therapy: [facility] for tasks assessed/performed;Anxious Overall Cognitive Status: Within Functional Limits for tasks assessed                                        Exercises Total Joint Exercises Ankle  Circles/Pumps: AROM;Both;15 reps;Supine Quad Sets: AROM;Both;10 reps;Supine Heel Slides: AAROM;Left;20 reps;Supine Hip ABduction/ADduction: AAROM;Left;15 reps;Supine    General Comments        Pertinent Vitals/Pain Pain Assessment: 0-10 Pain Score: 3  Pain Location: L hip Pain Descriptors / Indicators: Burning Pain Intervention(s): Limited activity within patient's tolerance;Monitored during session;Premedicated before session;Ice applied    Home Living                      Prior Function            PT Goals (current goals can now be found in the care plan section) Acute Rehab PT Goals Patient Stated Goal: get back to playing pickle ball PT Goal Formulation: With patient Time For Goal Achievement: 05/19/18 Potential to Achieve Goals: Good Progress towards PT goals: Progressing toward goals    Frequency    7X/week      PT Plan Current plan remains appropriate    Co-evaluation              AM-PAC PT "6 Clicks" Mobility   Outcome Measure  Help needed turning from your back to your side while in a flat bed without using bedrails?: A Little Help needed moving from lying on your  back to sitting on the side of a flat bed without using bedrails?: A Little Help needed moving to and from a bed to a chair (including a wheelchair)?: A Little Help needed standing up from a chair using your arms (e.g., wheelchair or bedside chair)?: A Little Help needed to walk in hospital room?: A Little Help needed climbing 3-5 steps with a railing? : A Little 6 Click Score: 18    End of Session Equipment Utilized During Treatment: Gait belt Activity Tolerance: Patient tolerated treatment well Patient left: in chair;with call bell/phone within reach;with chair alarm set;with family/visitor present Nurse Communication: Mobility status PT Visit Diagnosis: Difficulty in walking, not elsewhere classified (R26.2)     Time: 4076-8088 PT Time Calculation (min) (ACUTE ONLY):  24 min  Charges:  $Gait Training: 8-22 mins $Therapeutic Exercise: 8-22 mins                     [place] Pager [phone] Office [phone]    Luis Elliott 05/06/2018, 11:05 AM

## 2018-05-06 NOTE — Evaluation (Signed)
Occupational Therapy Evaluation Patient Details Name: Luis Elliott. MRN: [id number] DOB: [birth year] Today's Date: 05/06/2018    History of Present Illness Pt s/p L THR adn with hx of MI   Clinical Impression   OT education complete s/p THR.  No DMe needs. Pt has AE    Follow Up Recommendations  No OT follow up    Equipment Recommendations  None recommended by OT       Precautions / Restrictions Precautions Precautions: Fall Restrictions Weight Bearing Restrictions: No Other Position/Activity Restrictions: WBAT      Mobility Bed Mobility               General bed mobility comments: Pt up in chair   Transfers Overall transfer level: Needs assistance Equipment used: Rolling walker (2 wheeled) Transfers: Sit to/from [employer] Sit to Stand: Supervision Stand pivot transfers: Supervision       General transfer comment: cues for LE management and use of UEs to self assist    Balance Overall balance assessment: Mild deficits observed, not formally tested                                         ADL either performed or assessed with clinical judgement   ADL Overall ADL's : Needs assistance/impaired Eating/Feeding: Set up;Sitting   Grooming: Standing;Set up   Upper Body Bathing: Set up;Sitting   Lower Body Bathing: Minimal assistance;Sit to/from stand;Cueing for sequencing;Cueing for safety   Upper Body Dressing : Set up;Sitting   Lower Body Dressing: Minimal assistance;Sit to/from stand   Toilet Transfer: Supervision/safety;RW;Ambulation;Cueing for sequencing;Cueing for safety   Toileting- Water quality scientist and Hygiene: Supervision/safety;Sit to/from Nurse, children's Details (indicate cue type and reason): verbalized safety Functional mobility during ADLs: Supervision/safety General ADL Comments: wife will A as needed     Vision Patient Visual Report: No change from baseline               Pertinent Vitals/Pain Pain Assessment: 0-10 Pain Score: 2  Pain Location: L hip Pain Descriptors / Indicators: Dull Pain Intervention(s): Monitored during session     Hand Dominance     Extremity/Trunk Assessment Upper Extremity Assessment Upper Extremity Assessment: Overall WFL for tasks assessed           Communication Communication Communication: No difficulties   Cognition Arousal/Alertness: Awake/alert Behavior During Therapy: WFL for tasks assessed/performed;Anxious Overall Cognitive Status: Within Functional Limits for tasks assessed                                                Home Living Family/patient expects to be discharged to:: Private residence Living Arrangements: Spouse/significant other Available Help at Discharge: Family Type of Home: House Home Access: Stairs to enter Technical brewer of Steps: 2 Entrance Stairs-Rails: Right Home Layout: Two level Alternate Level Stairs-Number of Steps: 16 Alternate Level Stairs-Rails: Right           Home Equipment: None          Prior Functioning/Environment Level of Independence: Independent                          OT Goals(Current goals can be found in the care plan section)  Acute Rehab OT Goals Patient Stated Goal: get back to playing pickle ball  OT Frequency:      AM-PAC OT "6 Clicks" Daily Activity     Outcome Measure Help from another person eating meals?: None Help from another person taking care of personal grooming?: None Help from another person toileting, which includes using toliet, bedpan, or urinal?: None Help from another person bathing (including washing, rinsing, drying)?: A Little Help from another person to put on and taking off regular upper body clothing?: None Help from another person to put on and taking off regular lower body clothing?: A Little 6 Click Score: 22   End of Session Equipment Utilized During Treatment: Rolling  walker Nurse Communication: Mobility status  Activity Tolerance: Patient tolerated treatment well Patient left: in chair;with call bell/phone within reach                   Time: 1132-1147 OT Time Calculation (min): 15 min Charges:  OT General Charges $OT Visit: 1 Visit OT Evaluation $OT Eval Low Complexity: 1 Low  Kari Baars, OT Acute Rehabilitation Services [phone] Office- [phone]     Donnie Gedeon, Edwena Felty D 05/06/2018, 12:47 PM

## 2018-05-06 NOTE — Discharge Summary (Signed)
Patient ID: Luis Elliott. MRN: [id number] DOB/AGE: [birth year] 73 y.o.  Admit date: 05/05/2018 Discharge date: 05/06/2018  Admission Diagnoses:  Principal Problem:   Unilateral primary osteoarthritis, left hip Active Problems:   Status post total replacement of left hip   Discharge Diagnoses:  Same  Past Medical History:  Diagnosis Date  . Arthritis   . Cancer ([place])    Melanoma  . Hypertension   . Myocardial infarction ([place]) 06/25/2008    Surgeries: Procedure(s): LEFT TOTAL HIP ARTHROPLASTY ANTERIOR APPROACH on 05/05/2018   Consultants:   Discharged Condition: Improved  Hospital Course: Yi Haugan. is an 73 y.o. male who was admitted 05/05/2018 for operative treatment ofUnilateral primary osteoarthritis, left hip. Patient has severe unremitting pain that affects sleep, daily activities, and work/hobbies. After pre-op clearance the patient was taken to the operating room on 05/05/2018 and underwent  Procedure(s): LEFT TOTAL HIP ARTHROPLASTY ANTERIOR APPROACH.    Patient was given perioperative antibiotics:  Anti-infectives (From admission, onward)   Start     Dose/Rate Route Frequency Ordered Stop   05/05/18 1400  ceFAZolin (ANCEF) IVPB 1 g/50 mL premix     1 g 100 mL/hr over 30 Minutes Intravenous Every 6 hours 05/05/18 1010 05/05/18 2010   05/05/18 0600  ceFAZolin (ANCEF) IVPB 2g/100 mL premix     2 g 200 mL/hr over 30 Minutes Intravenous On call to O.R. 05/05/18 2458 05/05/18 0750       Patient was given sequential compression devices, early ambulation, and chemoprophylaxis to prevent DVT.  Patient benefited maximally from hospital stay and there were no complications.    Recent vital signs:  Patient Vitals for the past 24 hrs:  BP Temp Temp src Pulse Resp SpO2  05/06/18 1012 128/75 98.2 F (36.8 C) Oral 85 16 98 %  05/06/18 0453 133/87 97.7 F (36.5 C) Oral 79 16 97 %  05/06/18 0205 (!) 141/84 97.6 F (36.4 C) Oral 86 16 96 %   05/05/18 2115 139/80 98.4 F (36.9 C) Oral (!) 101 16 98 %  05/05/18 1851 126/67 97.9 F (36.6 C) Oral (!) 104 14 94 %  05/05/18 1307 132/82 (!) 97.5 F (36.4 C) Oral 86 14 99 %  05/05/18 1212 125/78 (!) 97.4 F (36.3 C) Oral 66 14 97 %     Recent laboratory studies:  Recent Labs    05/06/18 0335  WBC 14.8*  HGB 13.6  HCT 41.0  PLT 160  NA 137  K 3.9  CL 108  CO2 22  BUN 12  CREATININE 0.79  GLUCOSE 152*  CALCIUM 8.9     Discharge Medications:   Allergies as of 05/06/2018      Reactions   Niacin Hives      Medication List    STOP taking these medications   aspirin EC 81 MG tablet Replaced by:  aspirin 81 MG chewable tablet     TAKE these medications   acetaminophen 325 MG tablet Commonly known as:  TYLENOL Take 650 mg by mouth daily as needed for moderate pain or headache.   aspirin 81 MG chewable tablet Chew 1 tablet (81 mg total) by mouth 2 (two) times daily. Replaces:  aspirin EC 81 MG tablet   carvedilol 12.5 MG tablet Commonly known as:  COREG Take 12.5 mg by mouth 2 (two) times daily.   docusate sodium 100 MG capsule Commonly known as:  COLACE Take 100 mg by mouth 2 (two) times daily.   losartan 25 MG  tablet Commonly known as:  COZAAR Take 25 mg by mouth daily.   methocarbamol 500 MG tablet Commonly known as:  ROBAXIN Take 1 tablet (500 mg total) by mouth every 8 (eight) hours as needed for muscle spasms.   Oxycodone HCl 10 MG Tabs Take 1-1.5 tablets (10-15 mg total) by mouth every 4 (four) hours as needed for severe pain (pain score 7-10).   rosuvastatin 10 MG tablet Commonly known as:  CRESTOR Take 10 mg by mouth every evening.   tamsulosin 0.4 MG Caps capsule Commonly known as:  FLOMAX Take 0.4 mg by mouth daily.            Durable Medical Equipment  (From admission, onward)         Start     Ordered   05/05/18 1011  DME 3 n 1  Once     05/05/18 1010   05/05/18 1011  DME Walker rolling  Once    Question:  Patient  needs a walker to treat with the following condition  Answer:  Status post total replacement of left hip   05/05/18 1010          Diagnostic Studies: Dg Pelvis Portable  Result Date: 05/05/2018 CLINICAL DATA:  Status post left anterior hip arthroplasty. EXAM: PORTABLE PELVIS 1-2 VIEWS COMPARISON:  10/27/2017 FINDINGS: Interval left total hip arthroplasty. Hardware components are in anatomic alignment. No periprosthetic fracture or dislocation. IMPRESSION: 1. Status post left hip arthroplasty. Electronically Signed   By: Kerby Moors M.D.   On: 05/05/2018 08:58   Dg C-arm 1-60 Min-no Report  Result Date: 05/05/2018 Fluoroscopy was utilized by the requesting physician.  No radiographic interpretation.   Dg Hip Operative Unilat W Or W/o Pelvis Left  Result Date: 05/05/2018 CLINICAL DATA:  Left hip arthroplasty. EXAM: OPERATIVE left HIP (WITH PELVIS IF PERFORMED) 1 VIEWS TECHNIQUE: Fluoroscopic spot image(s) were submitted for interpretation post-operatively. COMPARISON:  None. FINDINGS: Two images from portable radiography obtained in the operating room show a left hip arthroplasty device. The hardware components are in anatomic alignment. No periprosthetic fracture or subluxation. IMPRESSION: 1. Status post left total hip arthroplasty. Electronically Signed   By: Kerby Moors M.D.   On: 05/05/2018 08:34    Disposition: Discharge disposition: 01-Home or Self Care       Discharge Instructions    Call MD / Call 911   Complete by:  As directed    If you experience chest pain or shortness of breath, CALL 911 and be transported to the hospital emergency room.  If you develope a fever above 101 F, pus (white drainage) or increased drainage or redness at the wound, or calf pain, call your surgeon's office.   Constipation Prevention   Complete by:  As directed    Drink plenty of fluids.  Prune juice may be helpful.  You may use a stool softener, such as Colace (over the counter) 100 mg  twice a day.  Use MiraLax (over the counter) for constipation as needed.   Diet - low sodium heart healthy   Complete by:  As directed    Increase activity slowly as tolerated   Complete by:  As directed       Follow-up Information    Mcarthur Rossetti, MD Follow up in 2 week(s).   Specialty:  Orthopedic Surgery Contact information: [place] [address] [phone]            Signed: Mcarthur Rossetti 05/06/2018, 11:05 AM

## 2018-05-06 NOTE — Discharge Instructions (Signed)

## 2018-05-06 NOTE — Progress Notes (Signed)
Patient ID: Luis Haff., male   DOB: January 04, 1946, 73 y.o.   MRN: [id number] Doing well overall.  Can be discharged to home this afternoon.

## 2018-05-06 NOTE — Progress Notes (Signed)
Subjective: Patient stable.  Pain controlled.  He is flexing and moving his hip well with [place] the physical therapist.   Objective: Vital signs in last 24 hours: Temp:  [97.4 F (36.3 C)-98.4 F (36.9 C)] 97.7 F (36.5 C) (01/25 0453) Pulse Rate:  [59-104] 79 (01/25 0453) Resp:  [14-16] 16 (01/25 0453) BP: (119-149)/(67-87) 133/87 (01/25 0453) SpO2:  [94 %-100 %] 97 % (01/25 0453) Weight:  [83.9 kg] 83.9 kg (01/24 1000)  Intake/Output from previous day: 01/24 0701 - 01/25 0700 In: 2960.3 [P.O.:720; I.V.:1990.3; IV Piggyback:250] Out: 3150 [Urine:3050; Blood:100] Intake/Output this shift: Total I/O In: 438.7 [I.V.:438.7] Out: -   Exam:  Sensation intact distally No cellulitis present  Labs: Recent Labs    05/06/18 0335  HGB 13.6   Recent Labs    05/06/18 0335  WBC 14.8*  RBC 4.42  HCT 41.0  PLT 160   Recent Labs    05/06/18 0335  NA 137  K 3.9  CL 108  CO2 22  BUN 12  CREATININE 0.79  GLUCOSE 152*  CALCIUM 8.9   No results for input(s): LABPT, INR in the last 72 hours.  Assessment/Plan: Plan at this time is possible discharge home later today.  He will do stairs with physical therapy.  I think he will likely be able to go.  He has not voided yet.   G Scott Aryanne Gilleland 05/06/2018, 9:05 AM

## 2018-05-06 NOTE — Progress Notes (Signed)
Patient discharged to home w/ wife. Given all belongings, instructions, equipment. Patient and family verbalized understanding of all instructions. Escorted to pov via w/c.

## 2018-05-06 NOTE — Progress Notes (Addendum)
Physical Therapy Treatment Patient Details Name: Luis Elliott. MRN: [id number] DOB: 1945-08-05 Today's Date: 05/06/2018    History of Present Illness Pt s/p L THR and with hx of MI    PT Comments    Pt progressing well with mobility and eager for dc home.  Pt and spouse reviewed car transfers, stairs and home therex program with written instruction provided.     Follow Up Recommendations  Follow surgeon's recommendation for DC plan and follow-up therapies     Equipment Recommendations  Rolling walker with 5" wheels    Recommendations for Other Services       Precautions / Restrictions Precautions Precautions: Fall Restrictions Weight Bearing Restrictions: No Other Position/Activity Restrictions: WBAT    Mobility  Bed Mobility               General bed mobility comments: Pt up in chair and requests back to same  Transfers Overall transfer level: Needs assistance Equipment used: Rolling walker (2 wheeled) Transfers: Sit to/from Stand Sit to Stand: Supervision Stand pivot transfers: Supervision       General transfer comment: pt self-cues for LE management and use of UEs to self assist  Ambulation/Gait Ambulation/Gait assistance: Min guard;Supervision Gait Distance (Feet): 100 Feet Assistive device: Rolling walker (2 wheeled) Gait Pattern/deviations: Decreased step length - right;Decreased step length - left;Shuffle;Trunk flexed;Step-to pattern;Step-through pattern Gait velocity: decr   General Gait Details: cues for initial sequence, posture and position from [employer] Mobility    Modified Rankin (Stroke Patients Only)       Balance Overall balance assessment: Mild deficits observed, not formally tested Sitting-balance support: No upper extremity supported;Feet supported Sitting balance-Leahy Scale: Good       Standing balance-Leahy Scale: Good                              Cognition  Arousal/Alertness: Awake/alert Behavior During Therapy: WFL for tasks assessed/performed;Anxious Overall Cognitive Status: Within Functional Limits for tasks assessed                                        Exercises Total Joint Exercises Ankle Circles/Pumps: AROM;Both;15 reps;Supine Quad Sets: AROM;Both;10 reps;Supine Heel Slides: AAROM;Left;20 reps;Supine Hip ABduction/ADduction: AAROM;Left;15 reps;Supine    General Comments        Pertinent Vitals/Pain Pain Assessment: 0-10 Pain Score: 4  Pain Location: L hip Pain Descriptors / Indicators: Burning Pain Intervention(s): Limited activity within patient's tolerance;Monitored during session;Premedicated before session;Ice applied    Home Living Family/patient expects to be discharged to:: Private residence Living Arrangements: Spouse/significant other Available Help at Discharge: Family Type of Home: House Home Access: Stairs to enter Entrance Stairs-Rails: Right Home Layout: Two level Home Equipment: None      Prior Function Level of Independence: Independent          PT Goals (current goals can now be found in the care plan section) Acute Rehab PT Goals Patient Stated Goal: get back to playing pickle ball PT Goal Formulation: With patient Time For Goal Achievement: 05/19/18 Potential to Achieve Goals: Good Progress towards PT goals: Progressing toward goals    Frequency    7X/week      PT Plan Current plan remains appropriate    Co-evaluation  AM-PAC PT "6 Clicks" Mobility   Outcome Measure  Help needed turning from your back to your side while in a flat bed without using bedrails?: A Little Help needed moving from lying on your back to sitting on the side of a flat bed without using bedrails?: A Little Help needed moving to and from a bed to a chair (including a wheelchair)?: A Little Help needed standing up from a chair using your arms (e.g., wheelchair or bedside  chair)?: A Little Help needed to walk in hospital room?: A Little Help needed climbing 3-5 steps with a railing? : A Little 6 Click Score: 18    End of Session Equipment Utilized During Treatment: Gait belt Activity Tolerance: Patient tolerated treatment well Patient left: in chair;with call bell/phone within reach;with chair alarm set;with family/visitor present Nurse Communication: Mobility status PT Visit Diagnosis: Difficulty in walking, not elsewhere classified (R26.2)     Time: 4462-8638 PT Time Calculation (min) (ACUTE ONLY): 36 min  Charges:  $Gait Training: 8-22 mins $Therapeutic Activities: 8-22 mins                     [place] Pager [phone] Office [phone]    Brock Larmon 05/06/2018, 2:10 PM

## 2018-05-07 ENCOUNTER — Encounter (HOSPITAL_COMMUNITY): Payer: Self-pay | Admitting: Orthopaedic Surgery

## 2018-05-08 NOTE — Op Note (Signed)
NAME: Wagoner JR., [place] [id number] ACCOUNT [id number] DATE OF BIRTH:Nov 15, 1945 FACILITY: [facility] LOCATION: [facility] PHYSICIAN:Chaniece Barbato Kerry Fort, MD  OPERATIVE REPORT  DATE OF PROCEDURE:  05/05/2018  PREOPERATIVE DIAGNOSIS:  Primary osteoarthritis and degenerative joint disease, left hip.  POSTOPERATIVE DIAGNOSIS:  Primary osteoarthritis and degenerative joint disease, left hip.  PROCEDURE:  Left total hip arthroplasty through direct anterior approach.  IMPLANTS:  DePuy Sector Gription acetabular component size 52, 36____  neutral polyethylene liner, size 12 Corail femoral component with standard offset, size 36+1.5 metal hip ball.  SURGEON:  Lind Guest. Ninfa [place], MD  ASSISTANT:  Erskine Emery, PA-C  ANESTHESIA:  Spinal.  ANTIBIOTICS:  Two grams IV Ancef.  ESTIMATED BLOOD LOSS:  100 mL.  COMPLICATIONS:  None.  INDICATIONS:  The patient is a very thin 73 year old active individual with debilitating arthritis of actually both his hips.  His left hurts him much worse than the right, but x-rays show end-stage arthritis of both hips.  At this point, he has tried  and failed all forms of conservative treatment and does wish to proceed with total hip arthroplasty.  We had a long and thorough discussion about this.  We talked about the risk of acute blood loss anemia, nerve or vessel injury, fracture, infection,  dislocation, DVT and implant failure.  We talked about our goals being decreased pain, improved mobility and overall improved quality of life.  DESCRIPTION OF PROCEDURE:  After informed consent was obtained and appropriate left hip was marked, he was brought to the operating room and sat up on a stretcher.  Spinal anesthesia was obtained.  We then laid him in supine position on a stretcher.   Foley catheter was placed.  I assessed his leg lengths and found him preoperatively to be actually a little bit longer on his left side than the right side.   Traction boots were then placed on both his feet.  He was placed supine on the Hana fracture  table, the perineal post in place, and both legs in in-line skeletal traction with no traction applied.  We then assessed his legs preoperatively and radiographically as well.  We then prepped the left hip with DuraPrep and sterile drapes.  A time-out  was called, and he was identified as correct patient, correct left hip.  I then made an incision just inferior and posterior to the anterior superior iliac spine and carried this obliquely down the leg.  We dissected down tensor fascia lata muscle.   Tensor fascia was then divided longitudinally to proceed with direct anterior approach to the hip.  We identified and cauterized circumflex vessels.  I then identified the hip capsule, opened the hip capsule in an L-type format, finding moderate joint  effusion and significant periarticular osteophytes around the femoral head and neck.  We placed Cobra retractors around the medial and lateral femoral neck and then made a femoral neck cut with an oscillating saw and completed this with an osteotome.  We  placed a corkscrew guide in the femoral head and removed the femoral head in its entirety and found a wide area devoid of cartilage.  We then placed a bent Hohmann over the medial acetabular rim and removed remnants of the acetabular labrum and other  debris.  We then began reaming under direct visualization from a size 44 reamer going all the way in stepwise increments up to a size 51 with all reamers under direct visualization, the last reamer under direct fluoroscopy so we could obtain our depth  of  reaming, our inclination and anteversion.  We then placed the real DePuy Sector Gription acetabular component size 52 and a 36+0____ neutral polyethylene liner.  This again was placed under direct visualization and fluoroscopy.  Attention was then  turned to the femur.  With the leg externally rotated to 120 degrees,  extended and adducted, we were able to place a Mueller retractor medially and a Hohmann retractor behind the greater trochanter.  We released the lateral joint capsule and used a  box-cutting osteotome to enter the femoral canal and a rongeur to lateralize it and then began broaching from a size 8 broach using Corail broaching system going up to a size 12.  With the size 12 in place, we trialed a standard offset femoral neck and a  36+1.5 hip ball.  We brought the leg back over and up and with traction and internal rotation, reducing the pelvis, matching his leg length, offset, range of motion and stability.  Again, we were going to leave him longer on this side because that is  how we started off with as well with knowing that we will come to the other side to match it.  We then dislocated the hip and removed the trial components.  We placed the real Corail femoral component size 12 with standard offset and then the real 36+1.5  metal hip ball, again reduced this in the acetabulum and it was stable.  We then irrigated the soft tissue with normal saline solution using pulsatile lavage.  We closed the joint capsule with interrupted #1 Ethibond suture, followed by running #1  Vicryl and tensor fascia, 0 Vicryl in the deep tissue, 2-0 Vicryl subcutaneous tissue and interrupted staples on the skin.  Xeroform and Aquacel dressings were applied.  He was taken off the operative table and taken to recovery room in stable condition.   All final counts were correct.  There were no complications noted.  Of note, Benita Stabile, PA-C, assisted the entire case.  His assistance was crucial for facilitating all aspects of this case.  LN/NUANCE  D:05/05/2018 T:05/05/2018 JOB:[id number]

## 2018-05-09 ENCOUNTER — Telehealth (INDEPENDENT_AMBULATORY_CARE_PROVIDER_SITE_OTHER): Payer: Self-pay | Admitting: Orthopaedic Surgery

## 2018-05-09 NOTE — Telephone Encounter (Signed)
Patient aware [facility] will contact her soon for HHPT

## 2018-05-09 NOTE — Telephone Encounter (Signed)
Patient spouse called wanted Caryl Pina to call her in regard to PT w/[facility].  Please call Brenda(spouse) [phone]

## 2018-05-09 NOTE — Telephone Encounter (Signed)
Wife states she spoke with [facility] and they actually will take her and be there tomorrow

## 2018-05-09 NOTE — Progress Notes (Signed)
Received a call from spouse, Hassan Rowan, stating [name] has not shown up and when she contacted [facility] it says they are out of network with [facility]. I contacted [facility] with the wife's permission and they were able to accept and will see patient tomorrow. Information provided to [facility] to complete referral. Patient and wife were appreciative of assistance.

## 2018-05-09 NOTE — Telephone Encounter (Signed)
Patient's wife Hassan Rowan) called advised she spoke with [facility] and the order for HHPT was not received. The fax number to [facility] is [phone]. Hassan Rowan said they live in [place]. The number to contact Hassan Rowan is [phone] or [phone] Patient

## 2018-05-09 NOTE — Telephone Encounter (Signed)
Angie from [facility] called to let you know that the patient was discharge from [place] on Saturday and the hospital order [facility] PT from [facility].  Due to [facility] staffing, they are unable to service his area.  CB#[phone].  Thank you.

## 2018-05-09 NOTE — Care Management Note (Signed)
Case Management Note  Patient Details  Name: Domanique Luckett. MRN: [id number] Date of Birth: [birth year]  Subjective/Objective:                    Action/Plan:   Expected Discharge Date:  05/06/18               Expected Discharge Plan:  [place]  In-House Referral:  NA  Discharge planning Services  CM Consult  Post Acute Care Choice:  Home Health Choice offered to:  Patient  DME Arranged:  3-N-1 DME Agency:  [place]:  PT [place] Agency:  [facility]  Status of Service:  Completed, signed off  If discussed at [employer], dates discussed:    Additional Comments:  Kerin Salen 05/09/2018, 10:22 AM

## 2018-05-11 ENCOUNTER — Telehealth (INDEPENDENT_AMBULATORY_CARE_PROVIDER_SITE_OTHER): Payer: Self-pay | Admitting: Orthopaedic Surgery

## 2018-05-11 NOTE — Telephone Encounter (Signed)
Chris/[facility]/PT called for verbal orders 2x week for 2 weeks 1x week for [facility]

## 2018-05-11 NOTE — Telephone Encounter (Signed)
Verbal order given  

## 2018-05-18 ENCOUNTER — Ambulatory Visit (INDEPENDENT_AMBULATORY_CARE_PROVIDER_SITE_OTHER): Payer: Medicare HMO | Admitting: Orthopaedic Surgery

## 2018-05-18 ENCOUNTER — Encounter (INDEPENDENT_AMBULATORY_CARE_PROVIDER_SITE_OTHER): Payer: Self-pay | Admitting: Orthopaedic Surgery

## 2018-05-18 DIAGNOSIS — Z96642 Presence of left artificial hip joint: Secondary | ICD-10-CM

## 2018-05-18 NOTE — Progress Notes (Signed)
The patient is 2 weeks tomorrow status post a left total hip arthroplasty.  He is an active 73 year old gentleman.  He has been on aspirin twice a day and has been getting around a walker and a cane.  Home therapy started late for him.  On exam his leg looks great.  Staples line shows intact staples with no seroma.  Remove the staples in place Steri-Strips.  He can go down to once a day aspirin.  No transition to a cane and needs nothing as comfort and balance and coordination allows.  I will have home therapy try to extend him for another week to 2 weeks.  I did give him prescription for outpatient therapy to use as needed and if needed.  All question concerns were answered and addressed.  I will see him back in 4 weeks to see how is doing overall but no x-rays are needed.

## 2018-05-23 ENCOUNTER — Telehealth (INDEPENDENT_AMBULATORY_CARE_PROVIDER_SITE_OTHER): Payer: Self-pay | Admitting: Orthopaedic Surgery

## 2018-05-23 NOTE — Telephone Encounter (Signed)
Please send an order for PT for where ever he wants to go.  Post hip replacement.  Only work on balance, gait training and coordination.  Anterior approach.  No hip precautions.

## 2018-05-23 NOTE — Telephone Encounter (Signed)
Pt called in said he had surgery on Jan 24th and was told he to do physical therapy next and also he was given a prescription but he doesn't know the name of it, he has lost the prescription and he is requesting another one be sent to [facility] where the physical therapy will take place due to them requiring him to having that before starting treatment. Fax: [phone]

## 2018-05-25 NOTE — Telephone Encounter (Signed)
Patient aware this was faxed. 

## 2018-05-25 NOTE — Telephone Encounter (Signed)
[facility]  Fax [phone] attn cheryl      Patient wife called wanted to know if patient prescrip was faxed for PT. Please call patient or his wife to confirm the order was sent over.

## 2018-06-09 ENCOUNTER — Other Ambulatory Visit (INDEPENDENT_AMBULATORY_CARE_PROVIDER_SITE_OTHER): Payer: Self-pay

## 2018-06-09 ENCOUNTER — Telehealth (INDEPENDENT_AMBULATORY_CARE_PROVIDER_SITE_OTHER): Payer: Self-pay | Admitting: Orthopaedic Surgery

## 2018-06-09 MED ORDER — AMOXICILLIN 500 MG PO TABS: ORAL_TABLET | ORAL | 0 refills | Status: DC

## 2018-06-09 NOTE — Telephone Encounter (Signed)
Called into [facility]

## 2018-06-09 NOTE — Telephone Encounter (Signed)
Patient called stating that he has an dentist appointment on Wednesday, March the 4th and is needing an antibiotic called into his pharmacy.  CB#[phone].  Patient uses [facility] on [address] in [place].  Thank you

## 2018-06-15 ENCOUNTER — Ambulatory Visit (INDEPENDENT_AMBULATORY_CARE_PROVIDER_SITE_OTHER): Payer: Medicare HMO | Admitting: Orthopaedic Surgery

## 2018-06-22 ENCOUNTER — Other Ambulatory Visit: Payer: Self-pay

## 2018-06-22 ENCOUNTER — Encounter (INDEPENDENT_AMBULATORY_CARE_PROVIDER_SITE_OTHER): Payer: Self-pay | Admitting: Orthopaedic Surgery

## 2018-06-22 ENCOUNTER — Ambulatory Visit (INDEPENDENT_AMBULATORY_CARE_PROVIDER_SITE_OTHER): Payer: Medicare HMO | Admitting: Orthopaedic Surgery

## 2018-06-22 DIAGNOSIS — Z96642 Presence of left artificial hip joint: Secondary | ICD-10-CM

## 2018-06-22 NOTE — Progress Notes (Signed)
HPI: Luis Elliott returns today for follow-up of his left total hip arthroplasty.  He underwent left total hip arthroplasty 05/05/2018.  He states he is overall doing well he is increasing his range of motion.  He is going to outpatient therapy.  He just like to have better side to side movement so he can get back to pickle ball or tennis faster.   Physical exam: Left hip good range of motion without pain.  He is able to cross his legs.  Is full dorsiflexion plantarflexion ankle.  Calf supple nontender.  Ambulates with out any assistive device.  Impression: Status post left total hip arthroplasty  Plan: He will finish up with outpatient therapy progressed to a home exercise program with a personal trainer.  He will follow-up with [place] in 6 months sooner if there is any questions or concerns.  AP pelvis at follow-up.

## 2018-12-11 ENCOUNTER — Ambulatory Visit (INDEPENDENT_AMBULATORY_CARE_PROVIDER_SITE_OTHER): Payer: Medicare HMO | Admitting: Orthopaedic Surgery

## 2018-12-11 ENCOUNTER — Ambulatory Visit (INDEPENDENT_AMBULATORY_CARE_PROVIDER_SITE_OTHER): Payer: Medicare HMO

## 2018-12-11 DIAGNOSIS — Z96642 Presence of left artificial hip joint: Secondary | ICD-10-CM

## 2018-12-11 DIAGNOSIS — M1611 Unilateral primary osteoarthritis, right hip: Secondary | ICD-10-CM

## 2018-12-11 DIAGNOSIS — M25551 Pain in right hip: Secondary | ICD-10-CM | POA: Diagnosis not present

## 2018-12-11 NOTE — Progress Notes (Signed)
Office Visit Note   Patient: Luis Elliott.           Date of Birth: 01-25-1946           MRN: [id number] Visit Date: 12/11/2018              Requested by: Thomes Dinning, [address] [id number] [place],   [id number] PCP: Thomes Dinning, MD   Assessment & Plan: Visit Diagnoses:  1. Pain in right hip   2. Unilateral primary osteoarthritis, right hip   3. Status post total replacement of left hip     Plan: The patient does have quite significant arthritis in his right hip.  We will have him see Dr. Junius Roads on September 14 for an ultrasound-guided right hip steroid injection.  I will then see him back about a month after that to see how he is done overall.  As far as his left hip goes, follow-up will be as needed for the left hip since the implant looks good and he is doing well with left hip.  All question concerns were answered just.  There is a high likelihood that he will proceed with a right total hip in the future. Follow-Up Instructions: Return in about 2 months (around 02/10/2019).   Orders:  Orders Placed This Encounter  Procedures  . XR HIP UNILAT W OR W/O PELVIS 1V RIGHT   No orders of the defined types were placed in this encounter.     Procedures: No procedures performed   Clinical Data: No additional findings.   Subjective: Chief Complaint  Patient presents with  . Right Hip - Pain  Patient is well-known to me.  He is 7 months status post a left total hip arthroplasty.  He is an avid Firefighter.  His left hip is been doing well but the right hip is very stiff for him.  He has known severe osteoarthritis in the right hip.  The left hip was normally replaced because that bother him worse.  He did have bilateral hip arthritis.  He has no issues it relates to his left hip.  His right hip is become stiff and painful.  He has had intra-articular injection prior to surgery on the left hip.  He is never had one on the right hip.   This was with a steroid.  He denies any acute change in the medical status.  HPI  Review of Systems He currently denies any headache, chest pain, shortness of breath, fever, chills, nausea, vomiting  Objective: Vital Signs: There were no vitals taken for this visit.  Physical Exam He is alert or x3 and in no acute distress Ortho Exam Examination of his left operative hip shows that he moves smoothly and fluidly.  Examination of his right hip shows significant stiffness with significant decrease in rotation.  There is pain in the groin with attempts at rotation. Specialty Comments:  No specialty comments available.  Imaging: Xr Hip Unilat W Or W/o Pelvis 1v Right  Result Date: 12/11/2018 An AP pelvis and lateral both hips shows a well-seated total hip arthroplasty on the left side.  The right side does have severe osteoarthritis with significant superior lateral joint space narrowing.  There are periarticular osteophytes and cystic changes in the acetabulum.    PMFS History: Patient Active Problem List   Diagnosis Date Noted  . 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 Unilateral primary osteoarthritis, left hip 10/27/2017  . Unilateral  primary osteoarthritis, right hip 10/27/2017  . Pain in left hip 10/27/2017   Past Medical History:  Diagnosis Date  . Arthritis   . Cancer ([place])    Melanoma  . Hypertension   . Myocardial infarction ([place]) 06/25/2008    No family history on file.  Past Surgical History:  Procedure Laterality Date  . COLONOSCOPY  04/2015   Pt has every 5 years  . HERNIA REPAIR    . KNEE ARTHROSCOPY     x 3 (Last surgery 2009)  . MANDIBLE SURGERY     Mole on area was removed  . TONSILLECTOMY    . TOTAL HIP ARTHROPLASTY Left 05/05/2018   Procedure: LEFT TOTAL HIP ARTHROPLASTY ANTERIOR APPROACH;  Surgeon: Mcarthur Rossetti, MD;  Location: [facility];  Service: Orthopedics;  Laterality: Left;   Social History   Occupational History  .  Not on file  Tobacco Use  . Smoking status: Former Smoker    Packs/day: 2.00    Years: 5.00    Pack years: 10.00    Types: Cigarettes    Quit date: 08/10/1976    Years since quitting: 42.3  . Smokeless tobacco: Never Used  Substance and Sexual Activity  . Alcohol use: Yes    Alcohol/week: 1.0 standard drinks    Types: 1 Cans of beer per week    Comment: weekly  . Drug use: Never  . Sexual activity: Not on file

## 2018-12-25 ENCOUNTER — Ambulatory Visit (INDEPENDENT_AMBULATORY_CARE_PROVIDER_SITE_OTHER): Payer: Medicare HMO | Admitting: Family Medicine

## 2018-12-25 ENCOUNTER — Encounter: Payer: Self-pay | Admitting: Family Medicine

## 2018-12-25 ENCOUNTER — Ambulatory Visit: Payer: Self-pay | Admitting: Orthopaedic Surgery

## 2018-12-25 DIAGNOSIS — M25551 Pain in right hip: Secondary | ICD-10-CM

## 2018-12-25 NOTE — Progress Notes (Signed)
Subjective: Patient is here for ultrasound-guided intra-articular right hip injection.   This is a planned injection.  He has end-stage DJD.  He is doing well status post left hip replacement, hoping to postpone the right hip a little longer.  Objective: Pain and stiffness with passive flexion and internal rotation.  Procedure: Ultrasound-guided right hip injection: After sterile prep with Betadine, injected 8 cc 1% lidocaine without epinephrine and 40 mg methylprednisolone using a 22-gauge spinal needle, passing the needle through the iliofemoral ligament into the femoral head/neck junction.  Injectate was seen filling the joint capsule.  He had good relief during the immediate anesthetic phase.  Could contemplate prolotherapy with dextrose if this only gives short-term relief.

## 2019-01-24 ENCOUNTER — Ambulatory Visit (INDEPENDENT_AMBULATORY_CARE_PROVIDER_SITE_OTHER): Payer: Medicare HMO | Admitting: Orthopaedic Surgery

## 2019-01-24 ENCOUNTER — Other Ambulatory Visit: Payer: Self-pay

## 2019-01-24 ENCOUNTER — Encounter: Payer: Self-pay | Admitting: Orthopaedic Surgery

## 2019-01-24 DIAGNOSIS — M1611 Unilateral primary osteoarthritis, right hip: Secondary | ICD-10-CM

## 2019-01-24 DIAGNOSIS — Z96642 Presence of left artificial hip joint: Secondary | ICD-10-CM

## 2019-01-24 DIAGNOSIS — M25551 Pain in right hip: Secondary | ICD-10-CM

## 2019-01-24 NOTE — Progress Notes (Signed)
Office Visit Note   Patient: Luis Elliott.           Date of Birth: 1945/12/18           MRN: [id number] Visit Date: 01/24/2019              Requested by: Thomes Dinning, [id number] [place],  [place] [id number] PCP: Thomes Dinning, MD   Assessment & Plan: Visit Diagnoses:  1. Pain in right hip   2. Unilateral primary osteoarthritis, right hip   3. Status post total replacement of left hip     Plan: At this point we will proceed with setting him up for a hip replacement on the right hip.  Having had this before he is fully aware of the risk and benefits of surgery and what surgery involves.  He understands what his intraoperative and postoperative course entail.  All question concerns were answered and addressed.  Given the severity of his arthritis in the very conservative treatment we feel this is medically warranted at this point as does he.  We will work on getting surgery scheduled in the near future.  Follow-Up Instructions: Return for 2 weeks post-op.   Orders:  No orders of the defined types were placed in this encounter.  No orders of the defined types were placed in this encounter.     Procedures: No procedures performed   Clinical Data: No additional findings.   Subjective: Chief Complaint  Patient presents with  . Right Hip - Follow-up  Patient comes in today with continued follow-up for right hip osteoarthritis.  We actually replaced his left hip due to severe osteoarthritis in January of this year.  He is tried and failed all forms of conservative treatment for his right hip.  He most recently had an intra-articular injection of a steroid which helped significantly for about 2 to 3 weeks.  At this point his right hip pain is definitely affecting his mobility, his quality of life and his actives daily living to the point he does wish proceed with a total hip arthroplasty on the right side.  He is tried and failed  conservative treatment for well over a year now.  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  HPI  Review of Systems he currently denies any headache, chest pain, shortness of breath, fever, chills, nausea, vomiting  Objective: Vital Signs: There were no vitals taken for this visit.  Physical Exam He is alert and oriented x3 and in no acute distress Ortho Exam Examination of his left operative hip has smooth and full range of motion.  Examination of his right painful hip shows stiffness with internal and external rotation as well as significant pain with internal and external rotation. Specialty Comments:  No specialty comments available.  Imaging: No results found. Previous x-rays of the pelvis and right hip show well-seated total hip arthroplasty on the left side.  The right hip shows severe osteoarthritis.  There are periarticular osteophytes as well as sclerotic changes in the femoral head and acetabulum.  There is also significant joint space narrowing.  PMFS History: Patient Active Problem List   Diagnosis Date Noted  . 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 Unilateral primary osteoarthritis, left hip 10/27/2017  . Unilateral primary osteoarthritis, right hip 10/27/2017  . Pain in left hip 10/27/2017   Past Medical History:  Diagnosis Date  . Arthritis   . Cancer ([place])  Melanoma  . Hypertension   . Myocardial infarction ([place]) 06/25/2008    History reviewed. No pertinent family history.  Past Surgical History:  Procedure Laterality Date  . COLONOSCOPY  04/2015   Pt has every 5 years  . HERNIA REPAIR    . KNEE ARTHROSCOPY     x 3 (Last surgery 2009)  . MANDIBLE SURGERY     Mole on area was removed  . TONSILLECTOMY    . TOTAL HIP ARTHROPLASTY Left 05/05/2018   Procedure: LEFT TOTAL HIP ARTHROPLASTY ANTERIOR APPROACH;  Surgeon: Mcarthur Rossetti, MD;  Location: [facility];  Service: Orthopedics;  Laterality: Left;   Social  History   Occupational History  . Not on file  Tobacco Use  . Smoking status: Former Smoker    Packs/day: 2.00    Years: 5.00    Pack years: 10.00    Types: Cigarettes    Quit date: 08/10/1976    Years since quitting: 42.4  . Smokeless tobacco: Never Used  Substance and Sexual Activity  . Alcohol use: Yes    Alcohol/week: 1.0 standard drinks    Types: 1 Cans of beer per week    Comment: weekly  . Drug use: Never  . Sexual activity: Not on file

## 2019-02-08 ENCOUNTER — Other Ambulatory Visit: Payer: Self-pay

## 2019-02-15 ENCOUNTER — Other Ambulatory Visit: Payer: Self-pay | Admitting: Physician Assistant

## 2019-02-19 NOTE — Patient Instructions (Signed)
DUE TO COVID-19 ONLY ONE VISITOR IS ALLOWED TO COME WITH YOU AND STAY IN THE WAITING ROOM ONLY DURING PRE OP AND PROCEDURE. THE ONE VISITOR MAY VISIT WITH YOU IN YOUR PRIVATE ROOM DURING VISITING HOURS ONLY!!   COVID SWAB TESTING MUST BE COMPLETED ON: Today, Immediately after pre op appointment   [address] [place] [facility] pre surgical testing line (Must self quarantine after testing. Follow instructions on handout.)             Your procedure is scheduled on: Friday, Nov. 13, 2020   Report to [facility] Main  Entrance    Report to admitting at 9:45 AM   Call this number if you have problems the morning of surgery [phone]   Do not eat food :After Midnight.   May have liquids until 9:15 AM day of surgery   CLEAR LIQUID DIET  Foods Allowed                                                                     Foods Excluded  Water, Black Coffee and tea, regular and decaf                             liquids that you cannot  Plain Jell-O in any flavor  (No red)                                           see through such as: Fruit ices (not with fruit pulp)                                     milk, soups, orange juice  Iced Popsicles (No red)                                    All solid food Carbonated beverages, regular and diet                                    Apple juices Sports drinks like Gatorade (No red) Lightly seasoned clear broth or consume(fat free) Sugar, honey syrup  Sample Menu Breakfast                                Lunch                                     Supper Cranberry juice                    Beef broth                            Chicken broth Jell-O  Grape juice                           Apple juice Coffee or tea                        Jell-O                                      Popsicle                                                Coffee or tea                         Coffee or tea   Complete one Ensure drink the morning of surgery at 9:15 AM the day of surgery.   Brush your teeth the morning of surgery.   Do NOT smoke after Midnight   Take these medicines the morning of surgery with A SIP OF WATER: Carvedilol                               You may not have any metal on your body including jewelry, and body piercings             Do not wear lotions, powders, perfumes/cologne, or deodorant                         Men may shave face and neck.   Do not bring valuables to the hospital. [place].   Contacts, dentures or bridgework may not be worn into surgery.   Bring small overnight bag day of surgery.    Special Instructions: Bring a copy of your healthcare power of attorney and living will documents         the day of surgery if you haven't scanned them in before.              Please read over the following fact sheets you were given:  [facility] - Preparing for Surgery Before surgery, you can play an important role.  Because skin is not sterile, your skin needs to be as free of germs as possible.  You can reduce the number of germs on your skin by washing with CHG (chlorahexidine gluconate) soap before surgery.  CHG is an antiseptic cleaner which kills germs and bonds with the skin to continue killing germs even after washing. Please DO NOT use if you have an allergy to CHG or antibacterial soaps.  If your skin becomes reddened/irritated stop using the CHG and inform your nurse when you arrive at Short Stay. Do not shave (including legs and underarms) for at least 48 hours prior to the first CHG shower.  You may shave your face/neck.  Please follow these instructions carefully:  1.  Shower with CHG Soap the night before surgery and the  morning of surgery.  2.  If you choose to wash your hair, wash your hair first as usual with your normal  shampoo.  3.  After you shampoo, rinse your hair and body  thoroughly to remove the shampoo.                             4.  Use CHG as you would any other liquid soap.  You can apply chg directly to the skin and wash.  Gently with a scrungie or clean washcloth.  5.  Apply the CHG Soap to your body ONLY FROM THE NECK DOWN.   Do   not use on face/ open                           Wound or open sores. Avoid contact with eyes, ears mouth and   genitals (private parts).                       Wash face,  Genitals (private parts) with your normal soap.             6.  Wash thoroughly, paying special attention to the area where your    surgery  will be performed.  7.  Thoroughly rinse your body with warm water from the neck down.  8.  DO NOT shower/wash with your normal soap after using and rinsing off the CHG Soap.                9.  Pat yourself dry with a clean towel.            10.  Wear clean pajamas.            11.  Place clean sheets on your bed the night of your first shower and do not  sleep with pets. Day of Surgery : Do not apply any lotions/deodorants the morning of surgery.  Please wear clean clothes to the hospital/surgery center.  FAILURE TO FOLLOW THESE INSTRUCTIONS MAY RESULT IN THE CANCELLATION OF YOUR SURGERY  PATIENT SIGNATURE_________________________________  NURSE SIGNATURE__________________________________  ________________________________________________________________________   Adam Phenix  An incentive spirometer is a tool that can help keep your lungs clear and active. This tool measures how well you are filling your lungs with each breath. Taking long deep breaths may help reverse or decrease the chance of developing breathing (pulmonary) problems (especially infection) following:  A long period of time when you are unable to move or be active. BEFORE THE PROCEDURE   If the spirometer includes an indicator to show your best effort, your nurse or respiratory therapist will set it to a desired goal.  If possible,  sit up straight or lean slightly forward. Try not to slouch.  Hold the incentive spirometer in an upright position. INSTRUCTIONS FOR USE  1. Sit on the edge of your bed if possible, or sit up as far as you can in bed or on a chair. 2. Hold the incentive spirometer in an upright position. 3. Breathe out normally. 4. Place the mouthpiece in your mouth and seal your lips tightly around it. 5. Breathe in slowly and as deeply as possible, raising the piston or the ball toward the top of the column. 6. Hold your breath for 3-5 seconds or for as long as possible. Allow the piston or ball to fall to the bottom of the column. 7. Remove the mouthpiece from your mouth and breathe out normally. 8. Rest for a few seconds and repeat Steps 1 through 7 at least 10  times every 1-2 hours when you are awake. Take your time and take a few normal breaths between deep breaths. 9. The spirometer may include an indicator to show your best effort. Use the indicator as a goal to work toward during each repetition. 10. After each set of 10 deep breaths, practice coughing to be sure your lungs are clear. If you have an incision (the cut made at the time of surgery), support your incision when coughing by placing a pillow or rolled up towels firmly against it. Once you are able to get out of bed, walk around indoors and cough well. You may stop using the incentive spirometer when instructed by your caregiver.  RISKS AND COMPLICATIONS  Take your time so you do not get dizzy or light-headed.  If you are in pain, you may need to take or ask for pain medication before doing incentive spirometry. It is harder to take a deep breath if you are having pain. AFTER USE  Rest and breathe slowly and easily.  It can be helpful to keep track of a log of your progress. Your caregiver can provide you with a simple table to help with this. If you are using the spirometer at home, follow these instructions: [place] IF:   You  are having difficultly using the spirometer.  You have trouble using the spirometer as often as instructed.  Your pain medication is not giving enough relief while using the spirometer.  You develop fever of 100.5 F (38.1 C) or higher. SEEK IMMEDIATE MEDICAL CARE IF:   You cough up bloody sputum that had not been present before.  You develop fever of 102 F (38.9 C) or greater.  You develop worsening pain at or near the incision site. MAKE SURE YOU:   Understand these instructions.  Will watch your condition.  Will get help right away if you are not doing well or get worse. Document Released: 08/09/2006 Document Revised: 06/21/2011 Document Reviewed: 10/10/2006 [facility] Patient Information 2014 [place], [place].   ________________________________________________________________________

## 2019-02-20 ENCOUNTER — Other Ambulatory Visit: Payer: Self-pay

## 2019-02-20 ENCOUNTER — Other Ambulatory Visit (HOSPITAL_COMMUNITY)
Admission: RE | Admit: 2019-02-20 | Discharge: 2019-02-20 | Disposition: A | Discharge status: Home / Self Care | Payer: Medicare HMO | Source: Ambulatory Visit | Attending: Orthopaedic Surgery | Admitting: Orthopaedic Surgery

## 2019-02-20 ENCOUNTER — Encounter (HOSPITAL_COMMUNITY)
Admission: RE | Admit: 2019-02-20 | Discharge: 2019-02-20 | Disposition: A | Discharge status: Home / Self Care | Payer: Medicare HMO | Source: Ambulatory Visit | Attending: Orthopaedic Surgery | Admitting: Orthopaedic Surgery

## 2019-02-20 ENCOUNTER — Encounter (HOSPITAL_COMMUNITY): Payer: Self-pay

## 2019-02-20 DIAGNOSIS — Z79899 Other long term (current) drug therapy: Secondary | ICD-10-CM | POA: Diagnosis not present

## 2019-02-20 DIAGNOSIS — I2510 Atherosclerotic heart disease of native coronary artery without angina pectoris: Secondary | ICD-10-CM | POA: Diagnosis not present

## 2019-02-20 DIAGNOSIS — M1611 Unilateral primary osteoarthritis, right hip: Secondary | ICD-10-CM | POA: Diagnosis not present

## 2019-02-20 DIAGNOSIS — Z7982 Long term (current) use of aspirin: Secondary | ICD-10-CM | POA: Diagnosis not present

## 2019-02-20 DIAGNOSIS — I252 Old myocardial infarction: Secondary | ICD-10-CM | POA: Diagnosis not present

## 2019-02-20 DIAGNOSIS — Z20828 Contact with and (suspected) exposure to other viral communicable diseases: Secondary | ICD-10-CM | POA: Diagnosis not present

## 2019-02-20 DIAGNOSIS — Z87891 Personal history of nicotine dependence: Secondary | ICD-10-CM | POA: Insufficient documentation

## 2019-02-20 DIAGNOSIS — I10 Essential (primary) hypertension: Secondary | ICD-10-CM | POA: Diagnosis not present

## 2019-02-20 DIAGNOSIS — Z7901 Long term (current) use of anticoagulants: Secondary | ICD-10-CM | POA: Diagnosis not present

## 2019-02-20 DIAGNOSIS — Z01812 Encounter for preprocedural laboratory examination: Secondary | ICD-10-CM | POA: Diagnosis present

## 2019-02-20 HISTORY — DX: Polyp of colon: K63.5

## 2019-02-20 LAB — BASIC METABOLIC PANEL
Anion gap: 8 (ref 5–15)
BUN: 13 mg/dL (ref 8–23)
CO2: 25 mmol/L (ref 22–32)
Calcium: 9.4 mg/dL (ref 8.9–10.3)
Chloride: 105 mmol/L (ref 98–111)
Creatinine, Ser: 0.98 mg/dL (ref 0.61–1.24)
GFR calc Af Amer: >60 mL/min (ref >60)
GFR calc non Af Amer: >60 mL/min (ref >60)
Glucose, Bld: 102 mg/dL — ABNORMAL HIGH (ref 70–99)
Potassium: 4.3 mmol/L (ref 3.5–5.1)
Sodium: 138 mmol/L (ref 135–145)

## 2019-02-20 LAB — CBC
HCT: 44.5 % (ref 39.0–52.0)
Hemoglobin: 14.5 g/dL (ref 13.0–17.0)
MCH: 30.3 pg (ref 26.0–34.0)
MCHC: 32.6 g/dL (ref 30.0–36.0)
MCV: 93.1 fL (ref 80.0–100.0)
Platelets: 189 10*3/uL (ref 150–400)
RBC: 4.78 MIL/uL (ref 4.22–5.81)
RDW: 12.5 % (ref 11.5–15.5)
WBC: 7 10*3/uL (ref 4.0–10.5)
nRBC: 0 % (ref 0.0–0.2)

## 2019-02-20 LAB — SURGICAL PCR SCREEN
MRSA, PCR: NEGATIVE
Staphylococcus aureus: POSITIVE — AB

## 2019-02-20 MED ORDER — CHLORHEXIDINE GLUCONATE 4 % EX LIQD
60.0000 mL | Freq: Once | CUTANEOUS | Status: DC
  Filled 2019-02-20: qty 60

## 2019-02-20 NOTE — Progress Notes (Addendum)
PCP - Dr. Karle Starch clearance on 12/22/2018 Cardiologist - 06/2018 Luis Elliott  Chest x-ray -  EKG - 04/26/2018 Stress Test -  ECHO -  Cardiac Cath -   Sleep Study - NA CPAP -   Fasting Blood Sugar -  Checks Blood Sugar _____ times a day  Blood Thinner Instructions: Aspirin Instructions: Last Dose:  Anesthesia review: MI,HTN,CAD  Patient denies shortness of breath, fever, cough and chest pain at PAT appointment   Patient verbalized understanding of instructions that were given to them at the PAT appointment. Patient was also instructed that they will need to review over the PAT instructions again at home before surgery.

## 2019-02-21 NOTE — Anesthesia Preprocedure Evaluation (Addendum)
Anesthesia Evaluation  Patient identified by MRN, date of birth, ID band Patient awake    Reviewed: Allergy & Precautions, NPO status , Patient's Chart, lab work & pertinent test results  Airway Mallampati: II  TM Distance: >3 FB Neck ROM: Full    Dental  (+) Teeth Intact, Dental Advisory Given   Pulmonary former smoker,    breath sounds clear to auscultation       Cardiovascular hypertension, Pt. on home beta blockers and Pt. on medications  Rhythm:Regular Rate:Normal     Neuro/Psych negative neurological ROS  negative psych ROS   GI/Hepatic negative GI ROS, Neg liver ROS,   Endo/Other  negative endocrine ROS  Renal/GU negative Renal ROS     Musculoskeletal  (+) Arthritis ,   Abdominal Normal abdominal exam  (+)   Peds  Hematology negative hematology ROS (+)   Anesthesia Other Findings   Reproductive/Obstetrics                           Lab Results  Component Value Date   WBC 7.0 02/20/2019   HGB 14.5 02/20/2019   HCT 44.5 02/20/2019   MCV 93.1 02/20/2019   PLT 189 02/20/2019   Lab Results  Component Value Date   CREATININE 0.98 02/20/2019   BUN 13 02/20/2019   NA 138 02/20/2019   K 4.3 02/20/2019   CL 105 02/20/2019   CO2 25 02/20/2019   No results found for: INR, PROTIME   Anesthesia Physical Anesthesia Plan  ASA: II  Anesthesia Plan: Spinal   Post-op Pain Management:    Induction: Intravenous  PONV Risk Score and Plan: 2 and Ondansetron, Propofol infusion and Treatment may vary due to age or medical condition  Airway Management Planned: Natural Airway and Simple Face Mask  Additional Equipment: None  Intra-op Plan:   Post-operative Plan:   Informed Consent: I have reviewed the patients History and Physical, chart, labs and discussed the procedure including the risks, benefits and alternatives for the proposed anesthesia with the patient or authorized  representative who has indicated his/her understanding and acceptance.       Plan Discussed with: CRNA  Anesthesia Plan Comments: (See PAT note 02/20/2019, Konrad Felix, PA-C)      Anesthesia Quick Evaluation

## 2019-02-21 NOTE — Progress Notes (Signed)
Anesthesia Chart Review   Case: [id number] Date/Time: 02/23/19 1200   Procedure: RIGHT TOTAL HIP ARTHROPLASTY ANTERIOR APPROACH (Right Hip)   Anesthesia type: Choice   Pre-op diagnosis: osteoarthritis right hip   [facility] ROOM 09 / [facility]   Surgeon: Mcarthur Rossetti, MD      DISCUSSION:73 y.o. former smoker (10 pack years, quit 08/10/76) with h/o HTN, CAD (MI, 3 stents 2010), right hip OA scheduled for above procedure 02/23/2019 with Dr. Jean Rosenthal.   Last seen by cardiologist, Dr. Atilano Median, 06/14/2018.  Stable at this visit, without angina.  1 year follow up recommended.   Pt seen by PCP, Dr. Karle Starch, 12/22/2018 for preoperative evaluation.  Per OV note, "Advised to discuss ASA with Dr. Atilano Median.  Advised to hold Losartan and Flomax the day of surgery.  Advised to take Carvedilol and Rosuvastatin day of surgery.  He had ACS 10 years ago.  3 stents.  Has been free of anginal pain since.  He is stable from IM point of view to proceed with surgery if scheduled."  Anticipate pt can proceed with planned procedure barring acute status change.   VS: BP 122/65 (BP Location: Right Arm)   Pulse 63   Temp 36.9 C (Oral)   Resp 18   Ht 5\' 8"  (1.727 m)   Wt 86.8 kg   SpO2 99%   BMI 29.10 kg/m   PROVIDERS: Yong Channel, MD is PCP   Gabrielle Dare, MD is Cardiologist  LABS: Labs reviewed: Acceptable for surgery. (all labs ordered are listed, but only abnormal results are displayed)  Labs Reviewed  SURGICAL PCR SCREEN - Abnormal; Notable for the following components:      Result Value   Staphylococcus aureus POSITIVE (*)    All other components within normal limits  BASIC METABOLIC PANEL - Abnormal; Notable for the following components:   Glucose, Bld 102 (*)    All other components within normal limits  CBC     IMAGES:   EKG: 04/26/2018 Rate 63 bpm Normal sinus rhythm  Left axis deviation   CV:  Past Medical History:  Diagnosis Date  . Arthritis   . Cancer [facility])     Melanoma 20004  . Colon polyps   . Hypertension   . Myocardial infarction ([place]) 06/25/2008    Past Surgical History:  Procedure Laterality Date  . COLONOSCOPY  04/2015   Pt has every 5 years  . HERNIA REPAIR    . KNEE ARTHROSCOPY     x 3 (Last surgery 2009)  . MANDIBLE SURGERY     Mole on area was removed  . TONSILLECTOMY    . TOTAL HIP ARTHROPLASTY Left 05/05/2018   Procedure: LEFT TOTAL HIP ARTHROPLASTY ANTERIOR APPROACH;  Surgeon: Mcarthur Rossetti, MD;  Location[facility];  Service: Orthopedics;  Laterality: Left;    MEDICATIONS: . acetaminophen (TYLENOL) 325 MG tablet  . amoxicillin (AMOXIL) 500 MG tablet  . aspirin 81 MG chewable tablet  . carvedilol (COREG) 12.5 MG tablet  . docusate sodium (COLACE) 100 MG capsule  . losartan (COZAAR) 25 MG tablet  . rosuvastatin (CRESTOR) 40 MG tablet  . tamsulosin (FLOMAX) 0.4 MG CAPS capsule  . vitamin B-12 (CYANOCOBALAMIN) 250 MCG tablet   No current facility-administered medications for this encounter.    Maia Plan [facility] Pre-Surgical Testing [phone] 02/21/19  10:30 AM

## 2019-02-21 NOTE — Progress Notes (Signed)
PCR results routed to Dr. Ninfa Linden via [place].

## 2019-02-22 LAB — NOVEL CORONAVIRUS, NAA (HOSP ORDER, SEND-OUT TO REF LAB; TAT 18-24 HRS): SARS-CoV-2, NAA: NOT DETECTED

## 2019-02-23 ENCOUNTER — Observation Stay (HOSPITAL_COMMUNITY)
Admission: RE | Admit: 2019-02-23 | Discharge: 2019-02-24 | Disposition: A | Discharge status: Home Health | Payer: Medicare HMO | Source: Other Acute Inpatient Hospital | Attending: Orthopaedic Surgery | Admitting: Orthopaedic Surgery

## 2019-02-23 ENCOUNTER — Observation Stay (HOSPITAL_COMMUNITY): Payer: Medicare HMO

## 2019-02-23 ENCOUNTER — Ambulatory Visit (HOSPITAL_COMMUNITY): Payer: Medicare HMO

## 2019-02-23 ENCOUNTER — Encounter (HOSPITAL_COMMUNITY)
Admission: RE | Disposition: A | Discharge status: Home Health | Payer: Self-pay | Source: Other Acute Inpatient Hospital | Attending: Orthopaedic Surgery

## 2019-02-23 ENCOUNTER — Ambulatory Visit (HOSPITAL_COMMUNITY): Payer: Medicare HMO | Admitting: Physician Assistant

## 2019-02-23 ENCOUNTER — Ambulatory Visit (HOSPITAL_COMMUNITY): Payer: Medicare HMO | Admitting: Anesthesiology

## 2019-02-23 ENCOUNTER — Encounter (HOSPITAL_COMMUNITY): Payer: Self-pay | Admitting: *Deleted

## 2019-02-23 ENCOUNTER — Other Ambulatory Visit: Payer: Self-pay

## 2019-02-23 DIAGNOSIS — Z87891 Personal history of nicotine dependence: Secondary | ICD-10-CM | POA: Insufficient documentation

## 2019-02-23 DIAGNOSIS — Z7982 Long term (current) use of aspirin: Secondary | ICD-10-CM | POA: Insufficient documentation

## 2019-02-23 DIAGNOSIS — I10 Essential (primary) hypertension: Secondary | ICD-10-CM | POA: Diagnosis not present

## 2019-02-23 DIAGNOSIS — Z96641 Presence of right artificial hip joint: Secondary | ICD-10-CM

## 2019-02-23 DIAGNOSIS — Z96642 Presence of left artificial hip joint: Secondary | ICD-10-CM | POA: Insufficient documentation

## 2019-02-23 DIAGNOSIS — I252 Old myocardial infarction: Secondary | ICD-10-CM | POA: Diagnosis not present

## 2019-02-23 DIAGNOSIS — Z85820 Personal history of malignant melanoma of skin: Secondary | ICD-10-CM | POA: Diagnosis not present

## 2019-02-23 DIAGNOSIS — M1611 Unilateral primary osteoarthritis, right hip: Secondary | ICD-10-CM | POA: Diagnosis present

## 2019-02-23 DIAGNOSIS — Z79899 Other long term (current) drug therapy: Secondary | ICD-10-CM | POA: Diagnosis not present

## 2019-02-23 DIAGNOSIS — Z888 Allergy status to other drugs, medicaments and biological substances status: Secondary | ICD-10-CM | POA: Insufficient documentation

## 2019-02-23 DIAGNOSIS — Z419 Encounter for procedure for purposes other than remedying health state, unspecified: Secondary | ICD-10-CM

## 2019-02-23 HISTORY — PX: TOTAL HIP ARTHROPLASTY: SHX124

## 2019-02-23 SURGERY — ARTHROPLASTY, HIP, TOTAL, ANTERIOR APPROACH
Anesthesia: Spinal | Site: Hip | Laterality: Right

## 2019-02-23 MED ORDER — PANTOPRAZOLE SODIUM 40 MG PO TBEC
40.0000 mg | DELAYED_RELEASE_TABLET | Freq: Every day | ORAL | Status: DC
  Administered 2019-02-24: 40 mg via ORAL
  Filled 2019-02-23: qty 1

## 2019-02-23 MED ORDER — TAMSULOSIN HCL 0.4 MG PO CAPS
0.8000 mg | ORAL_CAPSULE | Freq: Every day | ORAL | Status: DC
  Administered 2019-02-23 – 2019-02-24 (×2): 0.8 mg via ORAL
  Filled 2019-02-23 (×2): qty 2

## 2019-02-23 MED ORDER — PHENYLEPHRINE HCL (PRESSORS) 10 MG/ML IV SOLN
INTRAVENOUS | Status: DC | PRN
  Administered 2019-02-23 (×2): 40 ug via INTRAVENOUS

## 2019-02-23 MED ORDER — LIDOCAINE HCL (CARDIAC) PF 100 MG/5ML IV SOSY
PREFILLED_SYRINGE | INTRAVENOUS | Status: DC | PRN
  Administered 2019-02-23: 50 mg via INTRAVENOUS

## 2019-02-23 MED ORDER — MENTHOL 3 MG MT LOZG: 1.0000 | LOZENGE | OROMUCOSAL | Status: DC | PRN

## 2019-02-23 MED ORDER — POLYETHYLENE GLYCOL 3350 17 G PO PACK: 17.0000 g | PACK | Freq: Every day | ORAL | Status: DC | PRN

## 2019-02-23 MED ORDER — ACETAMINOPHEN 325 MG PO TABS: 325.0000 mg | ORAL_TABLET | Freq: Once | ORAL | Status: DC | PRN

## 2019-02-23 MED ORDER — LACTATED RINGERS IV SOLN: INTRAVENOUS | Status: DC

## 2019-02-23 MED ORDER — DIPHENHYDRAMINE HCL 12.5 MG/5ML PO ELIX: 12.5000 mg | ORAL_SOLUTION | ORAL | Status: DC | PRN

## 2019-02-23 MED ORDER — MIDAZOLAM HCL 2 MG/2ML IJ SOLN
INTRAMUSCULAR | Status: DC | PRN
  Administered 2019-02-23 (×2): 1 mg via INTRAVENOUS

## 2019-02-23 MED ORDER — ROSUVASTATIN CALCIUM 20 MG PO TABS
40.0000 mg | ORAL_TABLET | Freq: Every evening | ORAL | Status: DC
  Administered 2019-02-23 – 2019-02-24 (×2): 40 mg via ORAL
  Filled 2019-02-23 (×2): qty 2

## 2019-02-23 MED ORDER — STERILE WATER FOR IRRIGATION IR SOLN
Status: DC | PRN
  Administered 2019-02-23: 2000 mL

## 2019-02-23 MED ORDER — HYDROMORPHONE HCL 1 MG/ML IJ SOLN: 0.2500 mg | INTRAMUSCULAR | Status: DC | PRN

## 2019-02-23 MED ORDER — METHOCARBAMOL 500 MG IVPB - SIMPLE MED
500.0000 mg | Freq: Four times a day (QID) | INTRAVENOUS | Status: DC | PRN
  Filled 2019-02-23: qty 50

## 2019-02-23 MED ORDER — SODIUM CHLORIDE 0.9 % IR SOLN
Status: DC | PRN
  Administered 2019-02-23: 1000 mL

## 2019-02-23 MED ORDER — ONDANSETRON HCL 4 MG PO TABS
4.0000 mg | ORAL_TABLET | Freq: Four times a day (QID) | ORAL | Status: DC | PRN
  Administered 2019-02-24: 4 mg via ORAL
  Filled 2019-02-23: qty 1

## 2019-02-23 MED ORDER — DOCUSATE SODIUM 100 MG PO CAPS
100.0000 mg | ORAL_CAPSULE | Freq: Two times a day (BID) | ORAL | Status: DC
  Administered 2019-02-23 – 2019-02-24 (×2): 100 mg via ORAL
  Filled 2019-02-23 (×2): qty 1

## 2019-02-23 MED ORDER — LIDOCAINE 2% (20 MG/ML) 5 ML SYRINGE
INTRAMUSCULAR | Status: AC
  Filled 2019-02-23: qty 5

## 2019-02-23 MED ORDER — EPHEDRINE 5 MG/ML INJ
INTRAVENOUS | Status: AC
  Filled 2019-02-23: qty 10

## 2019-02-23 MED ORDER — CEFAZOLIN SODIUM-DEXTROSE 1-4 GM/50ML-% IV SOLN
1.0000 g | Freq: Four times a day (QID) | INTRAVENOUS | Status: AC
  Administered 2019-02-23 – 2019-02-24 (×2): 1 g via INTRAVENOUS
  Filled 2019-02-23 (×2): qty 50

## 2019-02-23 MED ORDER — ASPIRIN 81 MG PO CHEW
81.0000 mg | CHEWABLE_TABLET | Freq: Two times a day (BID) | ORAL | Status: DC
  Administered 2019-02-23 – 2019-02-24 (×2): 81 mg via ORAL
  Filled 2019-02-23 (×2): qty 1

## 2019-02-23 MED ORDER — 0.9 % SODIUM CHLORIDE (POUR BTL) OPTIME
TOPICAL | Status: DC | PRN
  Administered 2019-02-23: 1000 mL

## 2019-02-23 MED ORDER — SODIUM CHLORIDE 0.9 % IV SOLN
INTRAVENOUS | Status: DC
  Administered 2019-02-23 – 2019-02-24 (×2): via INTRAVENOUS

## 2019-02-23 MED ORDER — PROMETHAZINE HCL 25 MG/ML IJ SOLN: 6.2500 mg | INTRAMUSCULAR | Status: DC | PRN

## 2019-02-23 MED ORDER — ACETAMINOPHEN 325 MG PO TABS
325.0000 mg | ORAL_TABLET | Freq: Four times a day (QID) | ORAL | Status: DC | PRN
  Administered 2019-02-23 – 2019-02-24 (×2): 650 mg via ORAL
  Filled 2019-02-23 (×2): qty 2

## 2019-02-23 MED ORDER — METOCLOPRAMIDE HCL 5 MG/ML IJ SOLN
5.0000 mg | Freq: Three times a day (TID) | INTRAMUSCULAR | Status: DC | PRN
  Administered 2019-02-24: 10 mg via INTRAVENOUS
  Filled 2019-02-23 (×2): qty 2

## 2019-02-23 MED ORDER — CARVEDILOL 12.5 MG PO TABS
12.5000 mg | ORAL_TABLET | Freq: Two times a day (BID) | ORAL | Status: DC
  Administered 2019-02-23: 21:00:00 12.5 mg via ORAL
  Filled 2019-02-23 (×2): qty 1

## 2019-02-23 MED ORDER — CEFAZOLIN SODIUM-DEXTROSE 2-4 GM/100ML-% IV SOLN
2.0000 g | INTRAVENOUS | Status: AC
  Administered 2019-02-23: 13:00:00 2 g via INTRAVENOUS
  Filled 2019-02-23: qty 100

## 2019-02-23 MED ORDER — TRANEXAMIC ACID-NACL 1000-0.7 MG/100ML-% IV SOLN
1000.0000 mg | INTRAVENOUS | Status: AC
  Administered 2019-02-23: 13:00:00 1000 mg via INTRAVENOUS
  Filled 2019-02-23: qty 100

## 2019-02-23 MED ORDER — PROPOFOL 500 MG/50ML IV EMUL
INTRAVENOUS | Status: AC
  Filled 2019-02-23: qty 100

## 2019-02-23 MED ORDER — METOCLOPRAMIDE HCL 5 MG PO TABS: 5.0000 mg | ORAL_TABLET | Freq: Three times a day (TID) | ORAL | Status: DC | PRN

## 2019-02-23 MED ORDER — PHENOL 1.4 % MT LIQD: 1.0000 | OROMUCOSAL | Status: DC | PRN

## 2019-02-23 MED ORDER — LACTATED RINGERS IV SOLN
INTRAVENOUS | Status: DC
  Administered 2019-02-23 (×2): via INTRAVENOUS

## 2019-02-23 MED ORDER — METHOCARBAMOL 500 MG PO TABS
500.0000 mg | ORAL_TABLET | Freq: Four times a day (QID) | ORAL | Status: DC | PRN
  Administered 2019-02-23 – 2019-02-24 (×3): 500 mg via ORAL
  Filled 2019-02-23 (×3): qty 1

## 2019-02-23 MED ORDER — ONDANSETRON HCL 4 MG/2ML IJ SOLN
4.0000 mg | Freq: Four times a day (QID) | INTRAMUSCULAR | Status: DC | PRN
  Administered 2019-02-23 – 2019-02-24 (×2): 4 mg via INTRAVENOUS
  Filled 2019-02-23 (×2): qty 2

## 2019-02-23 MED ORDER — ACETAMINOPHEN 160 MG/5ML PO SOLN: 325.0000 mg | Freq: Once | ORAL | Status: DC | PRN

## 2019-02-23 MED ORDER — PHENYLEPHRINE 40 MCG/ML (10ML) SYRINGE FOR IV PUSH (FOR BLOOD PRESSURE SUPPORT)
PREFILLED_SYRINGE | INTRAVENOUS | Status: AC
  Filled 2019-02-23: qty 10

## 2019-02-23 MED ORDER — SODIUM CHLORIDE 0.9 % IV SOLN
INTRAVENOUS | Status: DC | PRN
  Administered 2019-02-23: 15 ug/min via INTRAVENOUS

## 2019-02-23 MED ORDER — POVIDONE-IODINE 10 % EX SWAB
2.0000 "application " | Freq: Once | CUTANEOUS | Status: AC
  Administered 2019-02-23: 2 via TOPICAL

## 2019-02-23 MED ORDER — MEPERIDINE HCL 50 MG/ML IJ SOLN: 6.2500 mg | INTRAMUSCULAR | Status: DC | PRN

## 2019-02-23 MED ORDER — ACETAMINOPHEN 10 MG/ML IV SOLN: 1000.0000 mg | Freq: Once | INTRAVENOUS | Status: DC | PRN

## 2019-02-23 MED ORDER — EPHEDRINE SULFATE 50 MG/ML IJ SOLN
INTRAMUSCULAR | Status: DC | PRN
  Administered 2019-02-23 (×2): 10 mg via INTRAVENOUS

## 2019-02-23 MED ORDER — ALUM & MAG HYDROXIDE-SIMETH 200-200-20 MG/5ML PO SUSP: 30.0000 mL | ORAL | Status: DC | PRN

## 2019-02-23 MED ORDER — OXYCODONE HCL 5 MG PO TABS
5.0000 mg | ORAL_TABLET | ORAL | Status: DC | PRN
  Administered 2019-02-23 – 2019-02-24 (×5): 10 mg via ORAL
  Filled 2019-02-23 (×4): qty 2

## 2019-02-23 MED ORDER — CYANOCOBALAMIN 500 MCG PO TABS
250.0000 ug | ORAL_TABLET | Freq: Every day | ORAL | Status: DC
  Administered 2019-02-24: 250 ug via ORAL
  Filled 2019-02-23: qty 1

## 2019-02-23 MED ORDER — HYDROMORPHONE HCL 1 MG/ML IJ SOLN: 0.5000 mg | INTRAMUSCULAR | Status: DC | PRN

## 2019-02-23 MED ORDER — PROPOFOL 500 MG/50ML IV EMUL
INTRAVENOUS | Status: DC | PRN
  Administered 2019-02-23: 125 ug/kg/min via INTRAVENOUS

## 2019-02-23 MED ORDER — MIDAZOLAM HCL 2 MG/2ML IJ SOLN
INTRAMUSCULAR | Status: AC
  Filled 2019-02-23: qty 2

## 2019-02-23 MED ORDER — OXYCODONE HCL 5 MG PO TABS
10.0000 mg | ORAL_TABLET | ORAL | Status: DC | PRN
  Administered 2019-02-23: 15 mg via ORAL
  Filled 2019-02-23: qty 3
  Filled 2019-02-23: qty 2

## 2019-02-23 MED ORDER — PROPOFOL 500 MG/50ML IV EMUL
INTRAVENOUS | Status: AC
  Filled 2019-02-23: qty 50

## 2019-02-23 MED ORDER — BUPIVACAINE IN DEXTROSE 0.75-8.25 % IT SOLN
INTRATHECAL | Status: DC | PRN
  Administered 2019-02-23: 1.8 mL via INTRATHECAL

## 2019-02-23 MED ORDER — LOSARTAN POTASSIUM 25 MG PO TABS
25.0000 mg | ORAL_TABLET | Freq: Every day | ORAL | Status: DC
  Administered 2019-02-23: 25 mg via ORAL
  Filled 2019-02-23 (×2): qty 1

## 2019-02-23 SURGICAL SUPPLY — 43 items
BAG ZIPLOCK 12X15 (MISCELLANEOUS) ×3 IMPLANT
BENZOIN TINCTURE PRP APPL 2/3 (GAUZE/BANDAGES/DRESSINGS) IMPLANT
BLADE SAW SGTL 18X1.27X75 (BLADE) ×2 IMPLANT
BLADE SAW SGTL 18X1.27X75MM (BLADE) ×1
CLOSURE WOUND 1/2 X4 (GAUZE/BANDAGES/DRESSINGS)
COVER PERINEAL POST (MISCELLANEOUS) ×3 IMPLANT
COVER SURGICAL LIGHT HANDLE (MISCELLANEOUS) ×3 IMPLANT
COVER WAND RF STERILE (DRAPES) ×3 IMPLANT
DRAPE STERI IOBAN 125X83 (DRAPES) ×3 IMPLANT
DRAPE U-SHAPE 47X51 STRL (DRAPES) ×6 IMPLANT
DRESSING AQUACEL AG SP 3.5X10 (GAUZE/BANDAGES/DRESSINGS) ×1 IMPLANT
DRSG AQUACEL AG ADV 3.5X10 (GAUZE/BANDAGES/DRESSINGS) ×3 IMPLANT
DRSG AQUACEL AG SP 3.5X10 (GAUZE/BANDAGES/DRESSINGS) ×3
DURAPREP 26ML APPLICATOR (WOUND CARE) ×3 IMPLANT
ELECT REM PT RETURN 15FT ADLT (MISCELLANEOUS) ×3 IMPLANT
GAUZE XEROFORM 1X8 LF (GAUZE/BANDAGES/DRESSINGS) ×3 IMPLANT
GLOVE BIO SURGEON STRL SZ7.5 (GLOVE) ×3 IMPLANT
GLOVE BIOGEL PI IND STRL 8 (GLOVE) ×2 IMPLANT
GLOVE BIOGEL PI INDICATOR 8 (GLOVE) ×4
GLOVE ECLIPSE 8.0 STRL XLNG CF (GLOVE) ×3 IMPLANT
GOWN STRL REUS W/TWL XL LVL3 (GOWN DISPOSABLE) ×6 IMPLANT
HANDPIECE INTERPULSE COAX TIP (DISPOSABLE) ×2
HEAD M SROM 36MM PLUS 1.5 (Hips) ×1 IMPLANT
HOLDER FOLEY CATH W/STRAP (MISCELLANEOUS) ×3 IMPLANT
KIT TURNOVER KIT A (KITS) IMPLANT
LINER ACETAB NEUTRAL 36ID 520D (Liner) ×3 IMPLANT
PACK ANTERIOR HIP CUSTOM (KITS) ×3 IMPLANT
PIN SECTOR W/GRIP ACE CUP 52MM (Hips) ×3 IMPLANT
SET HNDPC FAN SPRY TIP SCT (DISPOSABLE) ×1 IMPLANT
SLEEVE SURGEON STRL (DRAPES) ×3 IMPLANT
SROM M HEAD 36MM PLUS 1.5 (Hips) ×3 IMPLANT
STAPLER VISISTAT 35W (STAPLE) ×3 IMPLANT
STEM CORAIL KA12 (Stem) ×3 IMPLANT
STRIP CLOSURE SKIN 1/2X4 (GAUZE/BANDAGES/DRESSINGS) IMPLANT
SUT ETHIBOND NAB CT1 #1 30IN (SUTURE) ×6 IMPLANT
SUT ETHILON 2 0 PS N (SUTURE) IMPLANT
SUT MNCRL AB 4-0 PS2 18 (SUTURE) IMPLANT
SUT VIC AB 0 CT1 36 (SUTURE) ×3 IMPLANT
SUT VIC AB 1 CT1 36 (SUTURE) ×3 IMPLANT
SUT VIC AB 2-0 CT1 27 (SUTURE) ×4
SUT VIC AB 2-0 CT1 TAPERPNT 27 (SUTURE) ×2 IMPLANT
TRAY FOLEY MTR SLVR 16FR STAT (SET/KITS/TRAYS/PACK) ×3 IMPLANT
YANKAUER SUCT BULB TIP 10FT TU (MISCELLANEOUS) ×3 IMPLANT

## 2019-02-23 NOTE — H&P (Signed)
TOTAL HIP ADMISSION H&P  Patient is admitted for right total hip arthroplasty.  Subjective:  Chief Complaint: right hip pain  HPI: Luis Haff., 73 y.o. male, has a history of pain and functional disability in the right hip(s) due to arthritis and patient has failed non-surgical conservative treatments for greater than 12 weeks to include NSAID's and/or analgesics, corticosteriod injections, flexibility and strengthening excercises, supervised PT with diminished ADL's post treatment, use of assistive devices and activity modification.  Onset of symptoms was gradual starting 3 years ago with gradually worsening course since that time.The patient noted no past surgery on the right hip(s).  Patient currently rates pain in the right hip at 10 out of 10 with activity. Patient has night pain, worsening of pain with activity and weight bearing, trendelenberg gait, pain that interfers with activities of daily living and pain with passive range of motion. Patient has evidence of subchondral cysts, subchondral sclerosis, periarticular osteophytes and joint space narrowing by imaging studies. This condition presents safety issues increasing the risk of falls.  There is no current active infection.  Patient Active Problem List   Diagnosis Date Noted  . 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 Unilateral primary osteoarthritis, left hip 10/27/2017  . Unilateral primary osteoarthritis, right hip 10/27/2017  . Pain in left hip 10/27/2017   Past Medical History:  Diagnosis Date  . Arthritis   . Cancer [facility])    Melanoma 20004  . Colon polyps   . Hypertension   . Myocardial infarction ([place]) 06/25/2008    Past Surgical History:  Procedure Laterality Date  . COLONOSCOPY  04/2015   Pt has every 5 years  . HERNIA REPAIR    . KNEE ARTHROSCOPY     x 3 (Last surgery 2009)  . MANDIBLE SURGERY     Mole on area was removed  . TONSILLECTOMY    . TOTAL HIP ARTHROPLASTY Left  05/05/2018   Procedure: LEFT TOTAL HIP ARTHROPLASTY ANTERIOR APPROACH;  Surgeon: Mcarthur Rossetti, MD;  Location[facility];  Service: Orthopedics;  Laterality: Left;    No current facility-administered medications for this encounter.    Current Outpatient Medications  Medication Sig Dispense Refill Last Dose  . acetaminophen (TYLENOL) 325 MG tablet Take 650 mg by mouth daily as needed for moderate pain or headache.     Marland Kitchen aspirin 81 MG chewable tablet Chew 1 tablet (81 mg total) by mouth 2 (two) times daily. (Patient taking differently: Chew 81 mg by mouth daily. ) 42 tablet 0   . carvedilol (COREG) 12.5 MG tablet Take 12.5 mg by mouth 2 (two) times daily.  11   . docusate sodium (COLACE) 100 MG capsule Take 100 mg by mouth daily.      Marland Kitchen losartan (COZAAR) 25 MG tablet Take 25 mg by mouth daily.   1   . rosuvastatin (CRESTOR) 40 MG tablet Take 40 mg by mouth every evening.   1   . tamsulosin (FLOMAX) 0.4 MG CAPS capsule Take 0.8 mg by mouth daily.      . vitamin B-12 (CYANOCOBALAMIN) 250 MCG tablet Take 250 mcg by mouth daily.     Marland Kitchen amoxicillin (AMOXIL) 500 MG tablet 2 tabs by mouth one hour before dental cleaning, then 2 tabs by mouth six hours after appt (Patient not taking: Reported on 02/13/2019) 8 tablet 0 Not Taking at Unknown time   Allergies  Allergen Reactions  . Niacin Hives    Social History  Tobacco Use  . Smoking status: Former Smoker    Packs/day: 2.00    Years: 5.00    Pack years: 10.00    Types: Cigarettes    Quit date: 08/10/1976    Years since quitting: 42.5  . Smokeless tobacco: Never Used  Substance Use Topics  . Alcohol use: Yes    Alcohol/week: 1.0 - 2.0 standard drinks    Types: 1 - 2 Cans of beer per week    Comment: weekly    No family history on file.   Review of Systems  Musculoskeletal: Positive for joint pain.  All other systems reviewed and are negative.   Objective:  Physical Exam  Constitutional: He is oriented to person, place, and  time. He appears well-developed and well-nourished.  HENT:  Head: Normocephalic and atraumatic.  Eyes: Pupils are equal, round, and reactive to light. EOM are normal.  Neck: Normal range of motion. Neck supple.  Cardiovascular: Normal rate.  Respiratory: Effort normal.  GI: Soft.  Musculoskeletal:     Right hip: He exhibits decreased range of motion, decreased strength, tenderness and bony tenderness.  Neurological: He is alert and oriented to person, place, and time.  Skin: Skin is warm and dry.  Psychiatric: He has a normal mood and affect.    Vital signs in last 24 hours:    Labs:   Estimated body mass index is 29.1 kg/m as calculated from the following:   Height as of 02/20/19: 5\' 8"  (1.727 m).   Weight as of 02/20/19: 86.8 kg.   Imaging Review Plain radiographs demonstrate severe degenerative joint disease of the right hip(s). The bone quality appears to be good for age and reported activity level.      Assessment/Plan:  End stage arthritis, right hip(s)  The patient history, physical examination, clinical judgement of the provider and imaging studies are consistent with end stage degenerative joint disease of the right hip(s) and total hip arthroplasty is deemed medically necessary. The treatment options including medical management, injection therapy, arthroscopy and arthroplasty were discussed at length. The risks and benefits of total hip arthroplasty were presented and reviewed. The risks due to aseptic loosening, infection, stiffness, dislocation/subluxation,  thromboembolic complications and other imponderables were discussed.  The patient acknowledged the explanation, agreed to proceed with the plan and consent was signed. Patient is being admitted for inpatient treatment for surgery, pain control, PT, OT, prophylactic antibiotics, VTE prophylaxis, progressive ambulation and ADL's and discharge planning.The patient is planning to be discharged home with home health  services

## 2019-02-23 NOTE — Brief Op Note (Signed)
02/23/2019  2:09 PM  PATIENT:  Luis Elliott.  73 y.o. male  PRE-OPERATIVE DIAGNOSIS:  osteoarthritis right hip  POST-OPERATIVE DIAGNOSIS:  osteoarthritis right hip  PROCEDURE:  Procedure(s): RIGHT TOTAL HIP ARTHROPLASTY ANTERIOR APPROACH (Right)  SURGEON:  Surgeon(s) and Role:    Mcarthur Rossetti, MD - Primary  PHYSICIAN ASSISTANT: Benita Stabile, PA-C  ANESTHESIA:   spinal  EBL:  250 mL   BLOOD ADMIN  COUNTS:  YES  DICTATION: .Other Dictation: Dictation Number [id number]  PLAN OF CARE: Admit to inpatient   PATIENT DISPOSITION:  PACU - hemodynamically stable.   Delay start of Pharmacological VTE agent (>24hrs) due to surgical blood loss or risk of bleeding: no

## 2019-02-23 NOTE — Anesthesia Procedure Notes (Signed)
Spinal  Patient location during procedure: OR Start time: 02/23/2019 12:58 PM Staffing Anesthesiologist: Effie Berkshire, MD Resident/CRNA: Gerald Leitz, CRNA Performed: resident/CRNA  Preanesthetic Checklist Completed: patient identified, site marked, surgical consent, pre-op evaluation, timeout performed, IV checked, risks and benefits discussed and monitors and equipment checked Spinal Block Patient position: sitting Prep: DuraPrep Patient monitoring: heart rate, cardiac monitor, continuous pulse ox and blood pressure Approach: midline Location: L3-4 Injection technique: single-shot Needle Needle type: Pencan  Needle gauge: 24 G Needle length: 9 cm Assessment Sensory level: T4

## 2019-02-23 NOTE — Transfer of Care (Signed)
Immediate Anesthesia Transfer of Care Note  Patient: Luis Elliott.  Procedure(s) Performed: Procedure(s): RIGHT TOTAL HIP ARTHROPLASTY ANTERIOR APPROACH (Right)  Patient Location: PACU  Anesthesia Type:Spinal  Level of Consciousness: awake, alert  and oriented  Airway & Oxygen Therapy: Patient Spontanous Breathing  Post-op Assessment: Report given to RN and Post -op Vital signs reviewed and stable  Post vital signs: Reviewed and stable  Last Vitals:  Vitals:   02/23/19 1042 02/23/19 1430  BP: 127/70 96/83  Pulse: 71 80  Resp: 18 17  Temp: 36.9 C (!) 36.4 C  SpO2: 0000000 [id number]    Complications: No apparent anesthesia complications

## 2019-02-23 NOTE — Evaluation (Addendum)
Physical Therapy Evaluation Patient Details Name: Luis Elliott. MRN: [id number] DOB: December 20, 1945 Today's Date: 02/23/2019   History of Present Illness  s/p R DA THA; PMH: MI, L DA THA  Clinical Impression  Pt is s/p THA resulting in the deficits listed below (see PT Problem List).  Stand pivot only d/t pain and nausea--diaphoretic with near LOC after seated in recliner, VSS. however pt only requiring min assist for transfers. Should progress well once other issues resolve.  Pt will benefit from skilled PT to increase their independence and safety with mobility to allow discharge to the venue listed below.      Follow Up Recommendations Follow surgeon's recommendation for DC plan and follow-up therapies    Equipment Recommendations  Rolling walker with 5" wheels    Recommendations for Other Services       Precautions / Restrictions Precautions Precautions: Fall Restrictions Weight Bearing Restrictions: No Other Position/Activity Restrictions: WBAT      Mobility  Bed Mobility Overal bed mobility: Needs Assistance Bed Mobility: Rolling;Sidelying to Sit Rolling: Min guard Sidelying to sit: Min assist       General bed mobility comments: assist with trunk to upright, pt rolled to right per his preference  Transfers Overall transfer level: Needs assistance Equipment used: Rolling walker (2 wheeled) Transfers: Sit to/from [facility] Sit to Stand: Min assist Stand pivot transfers: Min assist       General transfer comment: assist to rise and transition to RW, cues for hand placement; stand pivoto only d/t pain and nausea  Ambulation/Gait                Stairs            Wheelchair Mobility    Modified Rankin (Stroke Patients Only)       Balance                                             Pertinent Vitals/Pain Pain Assessment: 0-10 Pain Score: 7  Pain Location: right hip and low back Pain  Descriptors / Indicators: Grimacing;Discomfort;Sore Pain Intervention(s): Limited activity within patient's tolerance;Monitored during session;Repositioned;Premedicated before session;Ice applied    Home Living Family/patient expects to be discharged to:: Private residence Living Arrangements: Spouse/significant other   Type of Home: House Home Access: Stairs to enter Entrance Stairs-Rails: Right Entrance Stairs-Number of Steps: 2 Home Layout: Two level Home Equipment: None      Prior Function Level of Independence: Independent               Hand Dominance        Extremity/Trunk Assessment   Upper Extremity Assessment Upper Extremity Assessment: Overall WFL for tasks assessed    Lower Extremity Assessment Lower Extremity Assessment: RLE deficits/detail RLE Deficits / Details: ankle WFL, knee and hip grossly 3/5, limited by post op pain       Communication   Communication: No difficulties  Cognition Arousal/Alertness: Awake/alert Behavior During Therapy: WFL for tasks assessed/performed Overall Cognitive Status: Within Functional Limits for tasks assessed                                        General Comments      Exercises Total Joint Exercises Ankle Circles/Pumps: AROM;Both;10 reps   Assessment/Plan  PT Assessment Patient needs continued PT services  PT Problem List Decreased strength;Decreased range of motion;Decreased activity tolerance;Pain;Decreased mobility       PT Treatment Interventions DME instruction;Gait training;Stair training;Functional mobility training;Therapeutic activities;Therapeutic exercise;Patient/family education    PT Goals (Current goals can be found in the Care Plan section)  Acute Rehab PT Goals PT Goal Formulation: With patient Time For Goal Achievement: 03/02/19 Potential to Achieve Goals: Good    Frequency 7X/week   Barriers to discharge        Co-evaluation               AM-PAC PT "6  Clicks" Mobility  Outcome Measure Help needed turning from your back to your side while in a flat bed without using bedrails?: A Little Help needed moving from lying on your back to sitting on the side of a flat bed without using bedrails?: A Little Help needed moving to and from a bed to a chair (including a wheelchair)?: A Little Help needed standing up from a chair using your arms (e.g., wheelchair or bedside chair)?: A Little Help needed to walk in hospital room?: A Little Help needed climbing 3-5 steps with a railing? : A Little 6 Click Score: 18    End of Session Equipment Utilized During Treatment: Gait belt Activity Tolerance: Patient tolerated treatment well Patient left: with call bell/phone within reach;in chair;with chair alarm set   PT Visit Diagnosis: Difficulty in walking, not elsewhere classified (R26.2)    Time: 1710-1743 PT Time Calculation (min) (ACUTE ONLY): 33 min   Charges:   PT Evaluation $PT Eval Low Complexity: 1 Low PT Treatments $Therapeutic Activity: 8-22 mins        Kenyon Ana, PT  Pager: [phone] Acute Rehab Dept [facility]): [phone]   02/23/2019   [facility] 02/23/2019, 5:55 PM

## 2019-02-23 NOTE — Op Note (Signed)
NAME: Luis JR., Luis Elliott ACCOUNT [id number] DATE OF BIRTH:09/29/1945 FACILITY: [facility] LOCATION: [facility] PHYSICIAN:Jens Siems [place], MD  OPERATIVE REPORT  DATE OF PROCEDURE:  02/23/2019  PREOPERATIVE DIAGNOSIS:  Primary osteoarthritis and degenerative joint disease, right hip.  POSTOPERATIVE DIAGNOSIS:  Primary osteoarthritis and degenerative joint disease, right hip.  PROCEDURE:  Right total hip arthroplasty through direct anterior approach.  IMPLANTS:  DePuy Sector Gription acetabular component size 52, size 36+0 neutral polyethylene liner, size 12 Corail femoral component with standard offset, size 36+1.5 metal hip ball.  SURGEON:  Lind Guest. Ninfa Linden, MD  ASSISTANT:  Erskine Emery, PA-C.  ANESTHESIA:  Spinal.  ANTIBIOTICS:  Two grams IV Ancef.  ESTIMATED BLOOD LOSS:  200-300 mL.  COMPLICATIONS:  None.  INDICATIONS:  The patient is a 73 year old gentleman well known to me.  He has debilitating arthritis and degenerative joint disease of his right hip.  We actually replaced his left hip earlier this year in January and he has done well with that hip.   His right hip pain is daily and it is severe.  It is detrimentally affecting his mobility his quality of life and his activities of daily living.  His x-rays do show complete loss of joint space and severe arthritis.  At this point, he does wish to  proceed with a total hip arthroplasty.  Having had this done before, he is fully aware of the risk of acute blood loss anemia, nerve and vessel injury, fracture, infection, dislocation, DVT and implant failure.  He understands our goals are to decrease  pain, improve mobility and overall improve quality of life.  DESCRIPTION OF PROCEDURE:  After informed consent was obtained and appropriate right hip was marked, he was brought to the operating room and sat up on a stretcher where spinal anesthesia was then obtained.  He was then laid in  supine position on a  stretcher.  A Foley catheter was placed.  I was able to truly assess his leg lengths.  He is slightly shorter on his right side than the left.  We then placed traction boots on both his feet and placed him supine on the Hana fracture table with perineal  post in place and both legs in line skeletal traction devices, but no traction applied.  His right operative hip was prepped and draped with DuraPrep and sterile drapes.  A time-out was called and he was identified duration correct right hip.  We then  made an incision just inferior and posterior to the anterior superior iliac spine and carried this obliquely down the leg.  We dissected down tensor fascia lata muscle.  Tensor fascia was then divided longitudinally to proceed with direct anterior  approach to the hip.  We identified and cauterized circumflex vessels.  I then identified the hip capsule, opened the hip capsule in an L-type format finding a large joint effusion and significant periarticular osteophytes around the femoral head and  neck.  I placed Cobra retractor around the medial and lateral femoral neck and made our femoral neck cut with an oscillating saw and completed this with an osteotome.  We placed a corkscrew guide in the femoral head and removed the femoral heads entirety  and found it to be devoid of cartilage.  We then placed a bent Hohmann over the medial acetabular rim and removed remnants of the acetabular labrum and other debris.  We then began reaming under direct visualization from a size 44 reamer in stepwise  increments up to a  size 51 with all reamers under direct visualization, the last reamer under direct fluoroscopy, so we could obtain our depth of reaming, our inclination and anteversion.  I then placed the real DePuy Sector Gription acetabular component  size 52 and a 36+0 neutral polyethylene liner for that size acetabular component.  Attention was then turned to the femur.  With the leg externally  rotated to 120 degrees, extended and adducted, we are to place a Mueller retractor medially and Hohmann  retractor above the greater trochanter, released lateral joint capsule and used a box-cutting osteotome to enter the femoral canal and a rongeur to lateralize.  We then began broaching using the Corail broaching system from a size 8 up to a size 12.   With the size 12 in place, we trialed a standard offset femoral neck and 36+15 hip ball rolled the leg back over in upward traction, internal rotation, reducing the pelvis.  We were pleased with the leg length, offset, range of motion and stability  assessed mechanically and radiographically.  We then dislocated the hip and removed the 5 trial components.  We placed the real Corail femoral component with standard offset size 12 and the real 36+1.5 metal hip ball again reduced this in the acetabulum  and it was stable.  We irrigated the soft tissue with normal saline solution.  We assessed the stability mechanically and radiographically.  We closed the joint capsule with interrupted #1 Ethibond suture.  We closed tensor fascia with interrupted #1  Vicryl, followed by 0 Vicryl to close deep tissue, 2-0 Vicryl was used to close the subcutaneous tissue and interrupted staples on the skin incision.  Xeroform and Aquacel dressing was applied.  He was taken off the Hana table and taken to recovery room  in stable condition.  All final counts were correct.  There were no complications noted.  Of note, Benita Stabile, PA-C, assisted the entire case.  His assistance was crucial for facilitating all aspects of this case.  TN/NUANCE  D:02/23/2019 T:02/23/2019 JOB:[id number]

## 2019-02-24 DIAGNOSIS — M1611 Unilateral primary osteoarthritis, right hip: Secondary | ICD-10-CM | POA: Diagnosis not present

## 2019-02-24 LAB — BASIC METABOLIC PANEL
Anion gap: 6 (ref 5–15)
BUN: 11 mg/dL (ref 8–23)
CO2: 23 mmol/L (ref 22–32)
Calcium: 8.7 mg/dL — ABNORMAL LOW (ref 8.9–10.3)
Chloride: 106 mmol/L (ref 98–111)
Creatinine, Ser: 0.83 mg/dL (ref 0.61–1.24)
GFR calc Af Amer: >60 mL/min (ref >60)
GFR calc non Af Amer: >60 mL/min (ref >60)
Glucose, Bld: 200 mg/dL — ABNORMAL HIGH (ref 70–99)
Potassium: 4.2 mmol/L (ref 3.5–5.1)
Sodium: 135 mmol/L (ref 135–145)

## 2019-02-24 LAB — CBC
HCT: 38.8 % — ABNORMAL LOW (ref 39.0–52.0)
Hemoglobin: 12.7 g/dL — ABNORMAL LOW (ref 13.0–17.0)
MCH: 30.8 pg (ref 26.0–34.0)
MCHC: 32.7 g/dL (ref 30.0–36.0)
MCV: 94.2 fL (ref 80.0–100.0)
Platelets: 161 10*3/uL (ref 150–400)
RBC: 4.12 MIL/uL — ABNORMAL LOW (ref 4.22–5.81)
RDW: 12.6 % (ref 11.5–15.5)
WBC: 11.9 10*3/uL — ABNORMAL HIGH (ref 4.0–10.5)
nRBC: 0 % (ref 0.0–0.2)

## 2019-02-24 MED ORDER — OXYCODONE HCL 5 MG PO TABS: 5.0000 mg | ORAL_TABLET | ORAL | 0 refills | Status: AC | PRN

## 2019-02-24 MED ORDER — ASPIRIN 81 MG PO CHEW: 81.0000 mg | CHEWABLE_TABLET | Freq: Two times a day (BID) | ORAL | 0 refills | Status: AC

## 2019-02-24 MED ORDER — ONDANSETRON 4 MG PO TBDP: 4.0000 mg | ORAL_TABLET | Freq: Three times a day (TID) | ORAL | 0 refills | Status: AC | PRN

## 2019-02-24 MED ORDER — METHOCARBAMOL 500 MG PO TABS: 500.0000 mg | ORAL_TABLET | Freq: Four times a day (QID) | ORAL | 1 refills | Status: AC | PRN

## 2019-02-24 NOTE — Progress Notes (Signed)
Physical Therapy Treatment Patient Details Name: Luis Elliott. MRN: [id number] DOB: 1945/09/25 Today's Date: 02/24/2019    History of Present Illness s/p R DA THA; PMH: MI, L DA THA    PT Comments    Pt reports he was feeling very poorly this afternoon but agreeable to participate with therapy. On standing to perform HEP pt reported increase in headache. Vital signs taken. BP 144/75, HR 92 BPM SpO2 99%. As session continued pt reported improvement in headache. Pt negotiated steps with min A and ambulated in hallway. He is expected to d/c today with HHPT to follow up.      Follow Up Recommendations  Follow surgeon's recommendation for DC plan and follow-up therapies     Equipment Recommendations  Rolling walker with 5" wheels    Recommendations for Other Services       Precautions / Restrictions Precautions Precautions: Fall Restrictions Weight Bearing Restrictions: No Other Position/Activity Restrictions: WBAT    Mobility  Bed Mobility Overal bed mobility: Needs Assistance Bed Mobility: Rolling;Sit to Sidelying Rolling: Min guard       Sit to sidelying: Min assist General bed mobility comments: min A for LE management onto bed.  Transfers Overall transfer level: Needs assistance Equipment used: Rolling walker (2 wheeled) Transfers: Sit to/from [facility] Sit to Stand: Min assist         General transfer comment: min A to power up and steady. Cues for hand placement  Ambulation/Gait Ambulation/Gait assistance: Min guard Gait Distance (Feet): 175 Feet Assistive device: Rolling walker (2 wheeled) Gait Pattern/deviations: Step-through pattern;Decreased stance time - right;Decreased step length - left;Decreased stride length;Decreased weight shift to right Gait velocity: decreased   General Gait Details: Overall improved gait quality from previous session. Min guard for safety and cues for postural control.    Stairs Stairs:  Yes Stairs assistance: Min assist Stair Management: No rails;Step to pattern;Backwards;With walker Number of Stairs: 2 General stair comments: Pt negotiated steps with min A for safety and stability. Cues for sequencing and technique.    Wheelchair Mobility    Modified Rankin (Stroke Patients Only)       Balance Overall balance assessment: Mild deficits observed, not formally tested                                          Cognition Arousal/Alertness: Awake/alert Behavior During Therapy: WFL for tasks assessed/performed Overall Cognitive Status: Within Functional Limits for tasks assessed                                        Exercises Total Joint Exercises Hip ABduction/ADduction: AROM;Right;10 reps;Standing Long Arc Quad: AROM;Right;10 reps;Seated Knee Flexion: AROM;Right;10 reps;Standing Marching in Standing: AROM;Right;10 reps;Standing Standing Hip Extension: AROM;Right;10 reps;Standing    General Comments General comments (skin integrity, edema, etc.): Pt's wife present and involved in session      Pertinent Vitals/Pain Pain Assessment: Faces Faces Pain Scale: Hurts even more Pain Location: right hip, low back, and head Pain Descriptors / Indicators: Grimacing;Discomfort;Sore Pain Intervention(s): Monitored during session;Limited activity within patient's tolerance;Repositioned    Home Living                      Prior Function  PT Goals (current goals can now be found in the care plan section) Acute Rehab PT Goals PT Goal Formulation: With patient Time For Goal Achievement: 03/02/19 Potential to Achieve Goals: Good Progress towards PT goals: Progressing toward goals    Frequency    7X/week      PT Plan Current plan remains appropriate    Co-evaluation              AM-PAC PT "6 Clicks" Mobility   Outcome Measure  Help needed turning from your back to your side while in a flat bed  without using bedrails?: A Little Help needed moving from lying on your back to sitting on the side of a flat bed without using bedrails?: A Little Help needed moving to and from a bed to a chair (including a wheelchair)?: A Little Help needed standing up from a chair using your arms (e.g., wheelchair or bedside chair)?: A Little Help needed to walk in hospital room?: A Little Help needed climbing 3-5 steps with a railing? : A Little 6 Click Score: 18    End of Session Equipment Utilized During Treatment: Gait belt Activity Tolerance: Patient tolerated treatment well Patient left: with call bell/phone within reach;in bed;with family/visitor present Nurse Communication: Mobility status PT Visit Diagnosis: Difficulty in walking, not elsewhere classified (R26.2)     Time: [id number] PT Time Calculation (min) (ACUTE ONLY): 42 min  Charges:  $Gait Training: 23-37 mins $Therapeutic Exercise: 8-22 mins                     Benjiman Core, [place] Pager [phone] Acute Rehab  Allena Katz 02/24/2019, 3:53 PM

## 2019-02-24 NOTE — Plan of Care (Signed)
  Problem: Clinical Measurements: Goal: Ability to maintain clinical measurements within normal limits will improve Outcome: Progressing Goal: Will remain free from infection Outcome: Progressing Goal: Diagnostic test results will improve Outcome: Progressing Goal: Cardiovascular complication will be avoided Outcome: Progressing   Problem: Activity: Goal: Risk for activity intolerance will decrease Outcome: Progressing   Problem: Nutrition: Goal: Adequate nutrition will be maintained Outcome: Progressing

## 2019-02-24 NOTE — Progress Notes (Signed)
Subjective: 1 Day Post-Op Procedure(s) (LRB): RIGHT TOTAL HIP ARTHROPLASTY ANTERIOR APPROACH (Right) Patient reports pain as mild.  Poor sleep last night.   Objective: Vital signs in last 24 hours: Temp:  [97.4 F (36.3 C)-98.4 F (36.9 C)] 98.3 F (36.8 C) (11/14 0443) Pulse Rate:  [66-110] 93 (11/14 0443) Resp:  [11-22] 18 (11/14 0443) BP: (96-143)/(61-84) 135/80 (11/14 0443) SpO2:  [96 %-100 %] 100 % (11/14 0443) Weight:  [86.8 kg] 86.8 kg (11/13 1728)  Intake/Output from previous day: 11/13 0701 - 11/14 0700 In: 4026.9 [P.O.:840; I.V.:3086.9; IV Piggyback:100] Out: 2550 [Urine:2300; Blood:250] Intake/Output this shift: No intake/output data recorded.  Recent Labs    02/24/19 0235  HGB 12.7*   Recent Labs    02/24/19 0235  WBC 11.9*  RBC 4.12*  HCT 38.8*  PLT 161   Recent Labs    02/24/19 0235  NA 135  K 4.2  CL 106  CO2 23  BUN 11  CREATININE 0.83  GLUCOSE 200*  CALCIUM 8.7*   No results for input(s): LABPT, INR in the last 72 hours.  Neurovascular intact Dorsiflexion/Plantar flexion intact Incision: dressing C/D/I Compartment soft   Assessment/Plan: 1 Day Post-Op Procedure(s) (LRB): RIGHT TOTAL HIP ARTHROPLASTY ANTERIOR APPROACH (Right) Up with therapy  Possible discharge to home later today.       Luis Elliott 02/24/2019, 9:10 AM

## 2019-02-24 NOTE — TOC Progression Note (Signed)
Transition of Care [facility]) - Progression Note    Patient Details  Name: Luis Elliott. MRN: [id number] Date of Birth: 1945-09-19  Transition of Care [facility]) CM/SW Contact  Joaquin Courts, RN Phone Number: 02/24/2019, 12:35 PM  Clinical Narrative:    CM spoke with patient at bedside. Patient set up with [facility] for HHPT. Reports has rolling walker at home, [facility] to deliver 3-in-1 to bedside for home use.    Expected Discharge Plan: [place] Barriers to Discharge: No Barriers Identified  Expected Discharge Plan and Services Expected Discharge Plan: [place]   Discharge Planning Services: CM Consult Post Acute Care Choice: [place] arrangements for the past 2 months: Single Family Home Expected Discharge Date: 02/24/19               DME Arranged: 3-N-1 DME Agency: [facility] Date DME Agency Contacted: 02/24/19 Time DME Agency Contacted: 1234 Representative spoke with at DME Agency: [place]: PT [place]: [facility] [place] Date [place]: 02/24/19 Time [place]: [id number] Representative spoke with at [place]: [place] Determinants of Health ([place]) Interventions    Readmission Risk Interventions No flowsheet data found.

## 2019-02-24 NOTE — Discharge Instructions (Signed)

## 2019-02-24 NOTE — Progress Notes (Signed)
Home health agencies that serve [id number].        [place] Quality of Patient Care Rating Patient Survey Summary Rating  [facility] [phone] 2  out of 5 stars 4 out of [place] [phone] 4 out of 5 stars 4 out of [place] [phone] 4 out of 5 stars 5 out of 5 stars  [place] [phone] 4  out of 5 stars 5 out of [place] [phone] 4 out of 5 stars 4 out of 5 stars  [place] [phone] 4 out of 5 stars 4 out of [place] [phone] 4  out of 5 stars 4 out of [place] [phone] 3  out of 5 stars 4 out of [place] [phone] 3 out of 5 stars 4 out of [place] [phone] 2  out of 5 stars 3 out of 5 stars  [facility] [phone] Not Available4 Not [place] [phone] 3  out of 5 stars 5 out of 5 stars  [facility] [phone] 4  out of 5 stars 3 out of 5 stars  [facility] (336) [phone] 3  out of 5 stars 4 out of [place] [phone] 3 out of [place] [phone] 3  out of 5 stars 4 out of [place] [phone] 3  out of 5 stars 4 out of [place] [phone] 4  out of 5 stars Not [place] [phone] 3  out of 5 stars 5 out of [place] [phone] 4  out of 5 stars 4 out of [place] number Footnote as displayed on [place]  1 This agency provides services under a federal  waiver program to non-traditional, chronic long term population.  2 This agency provides services to a special needs population.  3 Not Available.  4 The number of patient episodes for this measure is too small to report.  5 This measure currently does not have data or provider has been certified/recertified for less than 6 months.  6 The national average for this measure is not provided because of state-to-state differences in data collection.  7 Medicare is not displaying rates for this measure for any home health agency, because of an issue with the data.  8 There were problems with the data and they are being corrected.  9 Zero, or very few, patients met the survey's rules for inclusion. The scores shown, if any, reflect a very small number of surveys and may not accurately tell how an agency is doing.  10 Survey results are based on less than 12 months of data.  11 Fewer than 70 patients completed the survey. Use the scores shown, if any, with caution as the number of surveys may be too low to accurately tell how an agency is  doing.  12 No survey results are available for this period.  13 Data suppressed by CMS for one or more quarters.

## 2019-02-24 NOTE — Discharge Summary (Signed)
Patient ID: Luis Elliott. MRN: [id number] DOB/AGE: 1946-04-02 73 y.o.  Admit date: 02/23/2019 Discharge date: 02/24/2019  Admission Diagnoses:  Principal Problem:   Unilateral primary osteoarthritis, right hip Active Problems:   Status post total replacement of right hip   Discharge Diagnoses:  Same  Past Medical History:  Diagnosis Date  . Arthritis   . Cancer [facility])    Melanoma 20004  . Colon polyps   . Hypertension   . Myocardial infarction ([place]) 06/25/2008    Surgeries: Procedure(s): RIGHT TOTAL HIP ARTHROPLASTY ANTERIOR APPROACH on 02/23/2019   Consultants:   Discharged Condition: Improved  Hospital Course: Luis Elliott. is an 73 y.o. male who was admitted 02/23/2019 for operative treatment ofUnilateral primary osteoarthritis, right hip. Patient has severe unremitting pain that affects sleep, daily activities, and work/hobbies. After pre-op clearance the patient was taken to the operating room on 02/23/2019 and underwent  Procedure(s): RIGHT TOTAL HIP ARTHROPLASTY ANTERIOR APPROACH.    Patient was given perioperative antibiotics:  Anti-infectives (From admission, onward)   Start     Dose/Rate Route Frequency Ordered Stop   02/23/19 2000  ceFAZolin (ANCEF) IVPB 1 g/50 mL premix     1 g 100 mL/hr over 30 Minutes Intravenous Every 6 hours 02/23/19 1541 02/24/19 0303   02/23/19 1030  ceFAZolin (ANCEF) IVPB 2g/100 mL premix     2 g 200 mL/hr over 30 Minutes Intravenous On call to O.R. 02/23/19 1015 02/23/19 1250       Patient was given sequential compression devices, early ambulation, and chemoprophylaxis to prevent DVT.  Patient benefited maximally from hospital stay and there were no complications.    Recent vital signs:  Patient Vitals for the past 24 hrs:  BP Temp Temp src Pulse Resp SpO2 Height Weight  02/24/19 0443 135/80 98.3 F (36.8 C) - 93 18 100 % - -  02/24/19 0106 113/79 97.7 F (36.5 C) - (!) 103 18 100 % - -  02/23/19  2030 125/78 98.3 F (36.8 C) - (!) 110 16 100 % - -  02/23/19 1842 (!) 143/84 - - 93 16 100 % - -  02/23/19 1804 133/81 - - 79 15 100 % - -  02/23/19 1728 - - - - - - 5\' 8"  (1.727 m) 86.8 kg  02/23/19 1726 102/61 - - 75 - 100 % - -  02/23/19 1653 130/81 97.6 F (36.4 C) Oral 76 16 100 % - -  02/23/19 1545 135/77 (!) 97.5 F (36.4 C) Oral 67 15 100 % - -  02/23/19 1530 128/76 (!) 97.4 F (36.3 C) - 66 14 100 % - -  02/23/19 1515 123/72 - - 66 15 100 % - -  02/23/19 1500 120/68 - - 67 11 100 % - -  02/23/19 1445 118/69 - - 67 (!) 22 100 % - -  02/23/19 1430 96/83 (!) 97.5 F (36.4 C) - 80 17 100 % - -  02/23/19 1042 127/70 98.4 F (36.9 C) Oral 71 18 96 % - -     Recent laboratory studies:  Recent Labs    02/24/19 0235  WBC 11.9*  HGB 12.7*  HCT 38.8*  PLT 161  NA 135  K 4.2  CL 106  CO2 23  BUN 11  CREATININE 0.83  GLUCOSE 200*  CALCIUM 8.7*     Discharge Medications:   Allergies as of 02/24/2019      Reactions   Niacin Hives      Medication  List    STOP taking these medications   acetaminophen 325 MG tablet Commonly known as: TYLENOL     TAKE these medications   aspirin 81 MG chewable tablet Chew 1 tablet (81 mg total) by mouth 2 (two) times daily. What changed: when to take this   carvedilol 12.5 MG tablet Commonly known as: COREG Take 12.5 mg by mouth 2 (two) times daily.   docusate sodium 100 MG capsule Commonly known as: COLACE Take 100 mg by mouth daily.   losartan 25 MG tablet Commonly known as: COZAAR Take 25 mg by mouth daily.   methocarbamol 500 MG tablet Commonly known as: ROBAXIN Take 1 tablet (500 mg total) by mouth every 6 (six) hours as needed for muscle spasms.   oxyCODONE 5 MG immediate release tablet Commonly known as: Oxy IR/ROXICODONE Take 1-2 tablets (5-10 mg total) by mouth every 4 (four) hours as needed for moderate pain (pain score 4-6).   rosuvastatin 40 MG tablet Commonly known as: CRESTOR Take 40 mg by mouth  every evening.   tamsulosin 0.4 MG Caps capsule Commonly known as: FLOMAX Take 0.8 mg by mouth daily.   vitamin B-12 250 MCG tablet Commonly known as: CYANOCOBALAMIN Take 250 mcg by mouth daily.            Durable Medical Equipment  (From admission, onward)         Start     Ordered   02/23/19 1542  DME 3 n 1  Once     02/23/19 1541   02/23/19 1542  DME Walker rolling  Once    Question:  Patient needs a walker to treat with the following condition  Answer:  Status post total replacement of right hip   02/23/19 1541          Diagnostic Studies: Dg Pelvis Portable  Result Date: 02/23/2019 CLINICAL DATA:  Post right hip arthroplasty. EXAM: PORTABLE PELVIS 1-2 VIEWS COMPARISON:  12/11/2018 FINDINGS: Left hip arthroplasty unchanged. Interval placement of right total hip arthroplasty which is intact and normally located. Remainder of the exam is unchanged. IMPRESSION: Interval placement right hip arthroplasty intact. Stable left hip arthroplasty. Electronically Signed   By: Marin Olp M.D.   On: 02/23/2019 15:08   Dg C-arm 1-60 Min-no Report  Result Date: 02/23/2019 Fluoroscopy was utilized by the requesting physician.  No radiographic interpretation.   Dg Hip Operative Unilat W Or W/o Pelvis Right  Result Date: 02/23/2019 CLINICAL DATA:  Right total hip replacement. EXAM: OPERATIVE RIGHT HIP (WITH PELVIS IF PERFORMED)  VIEWS TECHNIQUE: Fluoroscopic spot image(s) were submitted for interpretation post-operatively. COMPARISON:  None. FINDINGS: Status post right total hip replacement. No evidence for immediate hardware complication. IMPRESSION: No complicating features. Electronically Signed   By: Misty Stanley M.D.   On: 02/23/2019 14:42    Disposition: Discharge disposition: 01-Home or Self Care         Follow-up Information    Mcarthur Rossetti, MD. Schedule an appointment as soon as possible for a visit in 2 day(s).   Specialty: Orthopedic  Surgery Contact information: [address] [place] [address] [phone]            Signed: Erskine Emery 02/24/2019, 9:17 AM

## 2019-02-24 NOTE — Progress Notes (Signed)
Physical Therapy Treatment Patient Details Name: Luis Elliott. MRN: [id number] DOB: 09/02/1945 Today's Date: 02/24/2019    History of Present Illness s/p R DA THA; PMH: MI, L DA THA    PT Comments    Pt is progressing well towards goals. Today's skilled session focused on gait and providing pt with HEP. Plan for stair training in PM prior to d/c.   Follow Up Recommendations  Follow surgeon's recommendation for DC plan and follow-up therapies     Equipment Recommendations  Rolling walker with 5" wheels    Recommendations for Other Services       Precautions / Restrictions Precautions Precautions: Fall Restrictions Weight Bearing Restrictions: No Other Position/Activity Restrictions: WBAT    Mobility  Bed Mobility               General bed mobility comments: in chair on arrival  Transfers Overall transfer level: Needs assistance Equipment used: Rolling walker (2 wheeled) Transfers: Sit to/from [employer] Transfers Sit to Stand: Min assist         General transfer comment: Min A to rise and steady with RW. Cues for hand placement.  Ambulation/Gait Ambulation/Gait assistance: Min guard Gait Distance (Feet): 60 Feet Assistive device: Rolling walker (2 wheeled) Gait Pattern/deviations: Step-through pattern;Decreased stance time - right;Decreased step length - left;Decreased stride length;Decreased weight shift to right;Trunk flexed;Antalgic     General Gait Details: Min guard for safety. Pt with mildly antalgic gait. Cues for postural control and increased step length. No overt LOB noted.   Stairs             Wheelchair Mobility    Modified Rankin (Stroke Patients Only)       Balance                                            Cognition Arousal/Alertness: Awake/alert Behavior During Therapy: WFL for tasks assessed/performed Overall Cognitive Status: Within Functional Limits for tasks assessed                                         Exercises Total Joint Exercises Ankle Circles/Pumps: AROM;Both;10 reps Quad Sets: AROM;Right;10 reps;Seated Short Arc Quad: AROM;Right;10 reps;Seated Heel Slides: AROM;Right;10 reps;Seated Hip ABduction/ADduction: AROM;Right;10 reps;Seated    General Comments        Pertinent Vitals/Pain Pain Assessment: Faces Faces Pain Scale: Hurts little more Pain Location: right hip and low back Pain Descriptors / Indicators: Grimacing;Discomfort;Sore Pain Intervention(s): Monitored during session;Limited activity within patient's tolerance;Repositioned;Ice applied    Home Living                      Prior Function            PT Goals (current goals can now be found in the care plan section) Acute Rehab PT Goals PT Goal Formulation: With patient Time For Goal Achievement: 03/02/19 Potential to Achieve Goals: Good Progress towards PT goals: Progressing toward goals    Frequency    7X/week      PT Plan Current plan remains appropriate    Co-evaluation              AM-PAC PT "6 Clicks" Mobility   Outcome Measure  Help needed turning from your back to your side while  in a flat bed without using bedrails?: A Little Help needed moving from lying on your back to sitting on the side of a flat bed without using bedrails?: A Little Help needed moving to and from a bed to a chair (including a wheelchair)?: A Little Help needed standing up from a chair using your arms (e.g., wheelchair or bedside chair)?: A Little Help needed to walk in hospital room?: A Little Help needed climbing 3-5 steps with a railing? : A Little 6 Click Score: 18    End of Session Equipment Utilized During Treatment: Gait belt Activity Tolerance: Patient tolerated treatment well Patient left: with call bell/phone within reach;in chair   PT Visit Diagnosis: Difficulty in walking, not elsewhere classified (R26.2)     Time: [id number] PT Time  Calculation (min) (ACUTE ONLY): 34 min  Charges:  $Gait Training: 8-22 mins $Therapeutic Exercise: 8-22 mins                    Benjiman Core, [place] Pager [phone] Acute Rehab   Allena Katz 02/24/2019, 11:04 AM

## 2019-02-24 NOTE — Care Management Obs Status (Signed)
[place] NOTIFICATION   Patient Details  Name: Luis Elliott. MRN: [id number] Date of Birth: 1946/03/04   Medicare Observation Status Notification Given:  Yes    Joaquin Courts, RN 02/24/2019, 9:49 AM

## 2019-02-26 ENCOUNTER — Encounter (HOSPITAL_COMMUNITY): Payer: Self-pay | Admitting: Orthopaedic Surgery

## 2019-02-26 NOTE — Anesthesia Postprocedure Evaluation (Signed)
Anesthesia Post Note  Patient: Luis Elliott.  Procedure(s) Performed: RIGHT TOTAL HIP ARTHROPLASTY ANTERIOR APPROACH (Right Hip)     Patient location during evaluation: PACU Anesthesia Type: Spinal Level of consciousness: oriented and awake and alert Pain management: pain level controlled Vital Signs Assessment: post-procedure vital signs reviewed and stable Respiratory status: spontaneous breathing, respiratory function stable and patient connected to nasal cannula oxygen Cardiovascular status: blood pressure returned to baseline and stable Postop Assessment: no headache, no backache and no apparent nausea or vomiting Anesthetic complications: no    Last Vitals:  Vitals:   02/24/19 1000 02/24/19 1326  BP: (!) 109/40 116/72  Pulse: 93 86  Resp: 20 15  Temp: 36.4 C 36.5 C  SpO2: 98% 98%    Last Pain:  Vitals:   02/24/19 1830  TempSrc:   PainSc: [name]

## 2019-02-28 ENCOUNTER — Telehealth: Payer: Self-pay | Admitting: Orthopaedic Surgery

## 2019-02-28 MED ORDER — TRAMADOL HCL 50 MG PO TABS: 50.0000 mg | ORAL_TABLET | Freq: Four times a day (QID) | ORAL | 0 refills | Status: AC | PRN

## 2019-02-28 NOTE — Telephone Encounter (Signed)
I called Hassan Rowan and advised.

## 2019-02-28 NOTE — Telephone Encounter (Signed)
Please advise 

## 2019-02-28 NOTE — Telephone Encounter (Signed)
I did send in some tramadol to his pharmacy to try which will be less strong than the oxycodone.  He can try either Aleve or ibuprofen as well.

## 2019-02-28 NOTE — Telephone Encounter (Signed)
Patient's wife Hassan Rowan called advised patient's BP and it was 80/40 when the nurse took it on Monday. Patient was also experiencing headache in the morning when he wakes up. The headache is in the front of his head. Hassan Rowan held patient's medicine for his BP and (Oxycodone) and the BP is now 114/80 for 2 days now. Hassan Rowan asked if the patient can take Tylenol or Ibuprofen? Hassan Rowan asked if there is something other than Oxycodone for the pain. The number to contact Hassan Rowan is [phone]

## 2019-03-06 ENCOUNTER — Telehealth: Payer: Self-pay | Admitting: Orthopaedic Surgery

## 2019-03-06 NOTE — Telephone Encounter (Signed)
Patient aware of the below medication

## 2019-03-06 NOTE — Telephone Encounter (Signed)
Patient's wife left a message yesterday wanting to know how long does he need to wear the support hose.  He is doing really well and moving around quite a bit during the day with minimal pain.  She also wanted to know if he could take Tylenol for a headache.  CB#[phone] or [phone].  Thank you.

## 2019-03-06 NOTE — Telephone Encounter (Signed)
He can stop wearing the compressive hose and he can take tylenol.

## 2019-03-12 ENCOUNTER — Other Ambulatory Visit: Payer: Self-pay

## 2019-03-12 ENCOUNTER — Encounter: Payer: Self-pay | Admitting: Orthopaedic Surgery

## 2019-03-12 ENCOUNTER — Ambulatory Visit (INDEPENDENT_AMBULATORY_CARE_PROVIDER_SITE_OTHER): Payer: Medicare HMO | Admitting: Orthopaedic Surgery

## 2019-03-12 DIAGNOSIS — Z96641 Presence of right artificial hip joint: Secondary | ICD-10-CM

## 2019-03-12 NOTE — Progress Notes (Signed)
The patient is 2 weeks post a right total hip arthroplasty.  We replaced his left hip in January of this year.  He is 73 years old and very active.  He is back down to just 1 daily aspirin.  He is ambulating more with a cane but he is using a walker today.  He is at home therapy has been great and he would like this to extend 1 more week after this week and I think that is reasonable.  With him laying supine his leg lengths are equal.  I did remove his right hip staples from the incision.  Incision looks good.  There is no significant seroma.  I did place some Steri-Strips.  He will continue increase his activities as comfort allows.  I will see him back in 4 weeks to see how he is doing overall.  All question concerns were answered and addressed.  No x-rays are needed in 4 weeks.

## 2019-04-09 ENCOUNTER — Encounter: Payer: Self-pay | Admitting: Orthopaedic Surgery

## 2019-04-09 ENCOUNTER — Ambulatory Visit (INDEPENDENT_AMBULATORY_CARE_PROVIDER_SITE_OTHER): Payer: Medicare HMO | Admitting: Orthopaedic Surgery

## 2019-04-09 ENCOUNTER — Other Ambulatory Visit: Payer: Self-pay

## 2019-04-09 DIAGNOSIS — Z96641 Presence of right artificial hip joint: Secondary | ICD-10-CM

## 2019-04-09 DIAGNOSIS — Z96642 Presence of left artificial hip joint: Secondary | ICD-10-CM

## 2019-04-09 NOTE — Progress Notes (Signed)
The patient is now 6-week status post a right total hip arthroplasty.  He is about 11 months status post a left total hip.  He still has a lot of start up pain on his right side when he first gets up and he feels unsteady.  Some of this he says is coming from his right knee.  Overall though he feels like he is making progress.  On exam sitting both hips move smoothly and fluidly.  When he does first get up he is experiencing some knee pain and groin pain.  His incision looks good otherwise.  He will continue to increase his activities as comfort allows.  I will see him back in 4 weeks to see how he is doing overall but no x-rays are needed.  If he still having right knee pain we may consider a steroid injection in his knee at that visit.

## 2019-05-01 ENCOUNTER — Ambulatory Visit: Payer: Medicare HMO

## 2019-05-07 ENCOUNTER — Encounter: Payer: Self-pay | Admitting: Orthopaedic Surgery

## 2019-05-07 ENCOUNTER — Other Ambulatory Visit: Payer: Self-pay

## 2019-05-07 ENCOUNTER — Ambulatory Visit (INDEPENDENT_AMBULATORY_CARE_PROVIDER_SITE_OTHER): Payer: Medicare HMO | Admitting: Orthopaedic Surgery

## 2019-05-07 DIAGNOSIS — Z96642 Presence of left artificial hip joint: Secondary | ICD-10-CM

## 2019-05-07 DIAGNOSIS — Z96641 Presence of right artificial hip joint: Secondary | ICD-10-CM

## 2019-05-07 NOTE — Progress Notes (Signed)
Luis Elliott is now 10-week status post a right total hip arthroplasty.  He is a year out from his left hip.  He is doing very well overall.  He has been walking several miles a week.  He would like to start playing pickle ball again in about 2 weeks and outdoor tennis in about 6 weeks.  Overall he has no issues.  He is very active and healthy 74 year old.  On exam he walks without a limp.  His leg lengths are equal.  Both hips have fluid internal and external rotation.  At this point we do not need to see him back for 6 months.  At that visit I would like a standing low AP pelvis and lateral of both hips.  All question concerns were answered and addressed.

## 2019-05-09 ENCOUNTER — Ambulatory Visit: Payer: Medicare HMO

## 2019-05-18 ENCOUNTER — Ambulatory Visit: Payer: Medicare HMO

## 2019-05-22 ENCOUNTER — Ambulatory Visit: Payer: Medicare HMO

## 2019-05-24 ENCOUNTER — Ambulatory Visit: Payer: Medicare HMO

## 2019-11-05 ENCOUNTER — Ambulatory Visit: Payer: Medicare HMO | Admitting: Orthopaedic Surgery

## 2019-11-08 ENCOUNTER — Encounter: Payer: Self-pay | Admitting: Orthopaedic Surgery

## 2019-11-08 ENCOUNTER — Ambulatory Visit (INDEPENDENT_AMBULATORY_CARE_PROVIDER_SITE_OTHER): Payer: Medicare HMO

## 2019-11-08 ENCOUNTER — Ambulatory Visit: Payer: Medicare HMO | Admitting: Orthopaedic Surgery

## 2019-11-08 DIAGNOSIS — Z96641 Presence of right artificial hip joint: Secondary | ICD-10-CM

## 2019-11-08 DIAGNOSIS — Z96642 Presence of left artificial hip joint: Secondary | ICD-10-CM | POA: Diagnosis not present

## 2019-11-08 NOTE — Progress Notes (Signed)
The patient is a very pleasant and active 74 year old gentleman who has a history of both his hips being replaced.  We replaced his left hip in January 2020 and his right hip in November 2020.  He says he is doing well overall.  He plays a lot of pickle ball and tennis.  He has no complaints.  On exam both hips move smoothly and fluidly.  His leg lengths are equal.  He does walk more externally rotated on the right lower extremity but he said that was that way before surgery as well.  A low AP pelvis lateral both hips shows well-seated implants with no complicating features.  This point follow-up for his hips can be as needed.  If he develops any type of dull aching pain or problems with is not going away and needs to be seen he will let us know.  All question concerns were answered and addressed.

## 2021-09-16 IMAGING — RF DG HIP (WITH PELVIS) OPERATIVE*R*
1 series · 2 of 2 positions shown · non-contrast
Comparison: None.

CLINICAL DATA: Right total hip replacement.

EXAM:
OPERATIVE RIGHT HIP (WITH PELVIS IF PERFORMED)  VIEWS
TECHNIQUE: Fluoroscopic spot image(s) were submitted for interpretation
post-operatively.

[Series 1: unknown protocol · 2 of 2 slices shown]
[im 1/2]
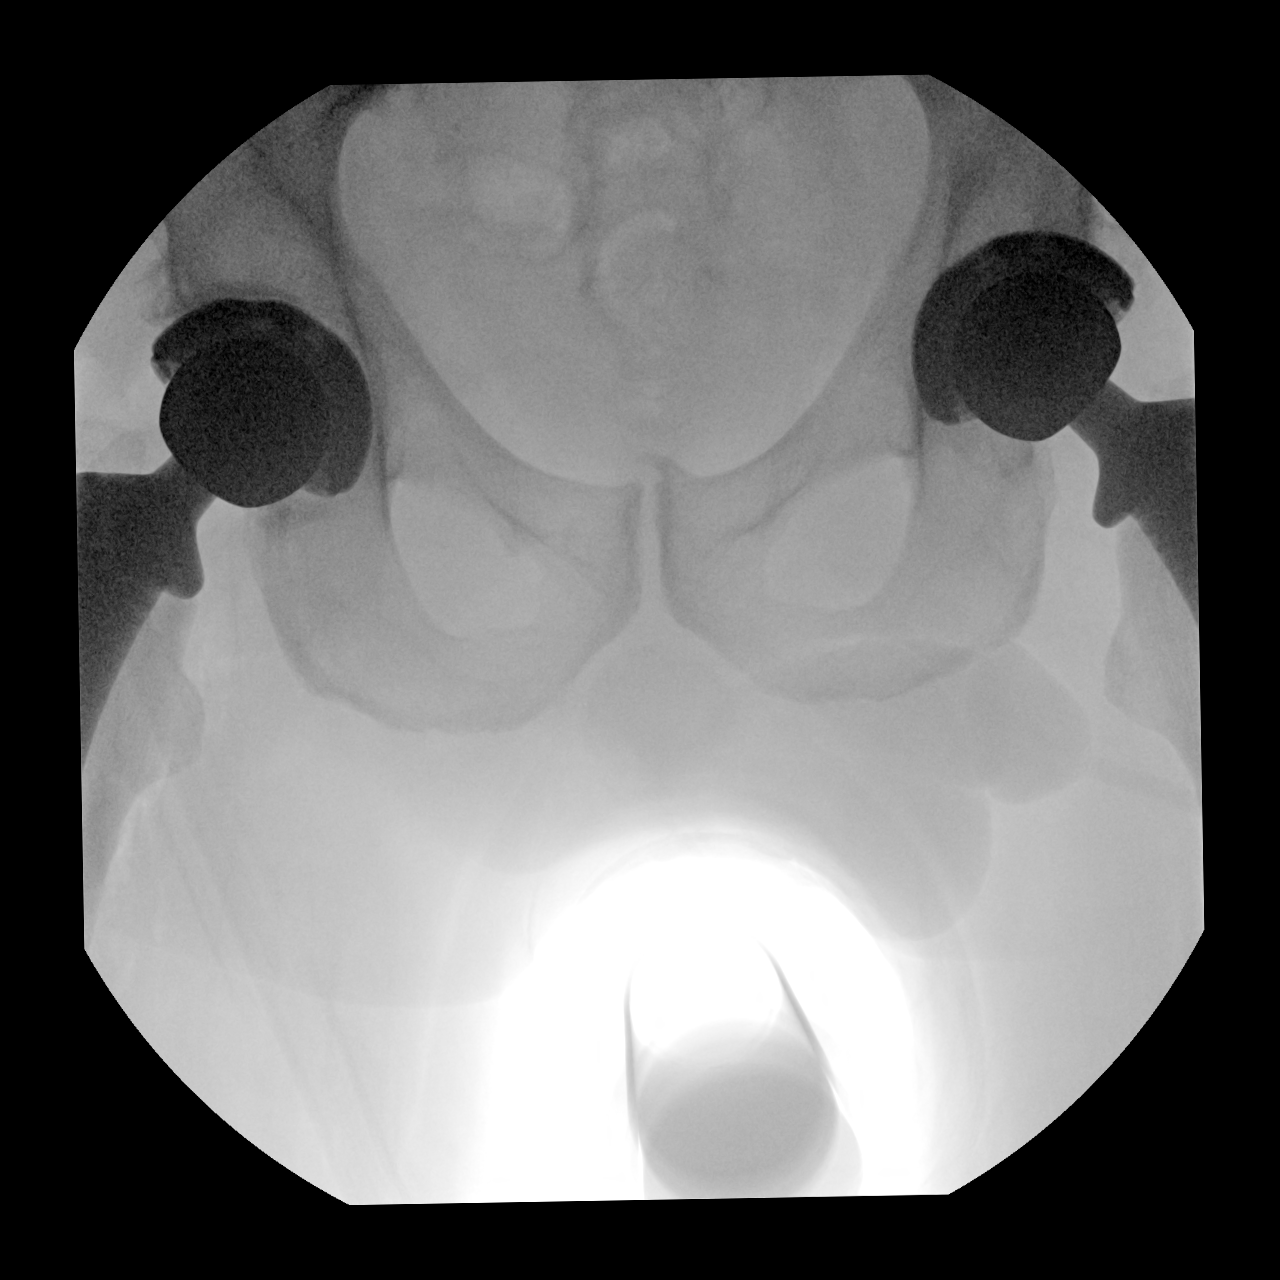
[im 2/2]
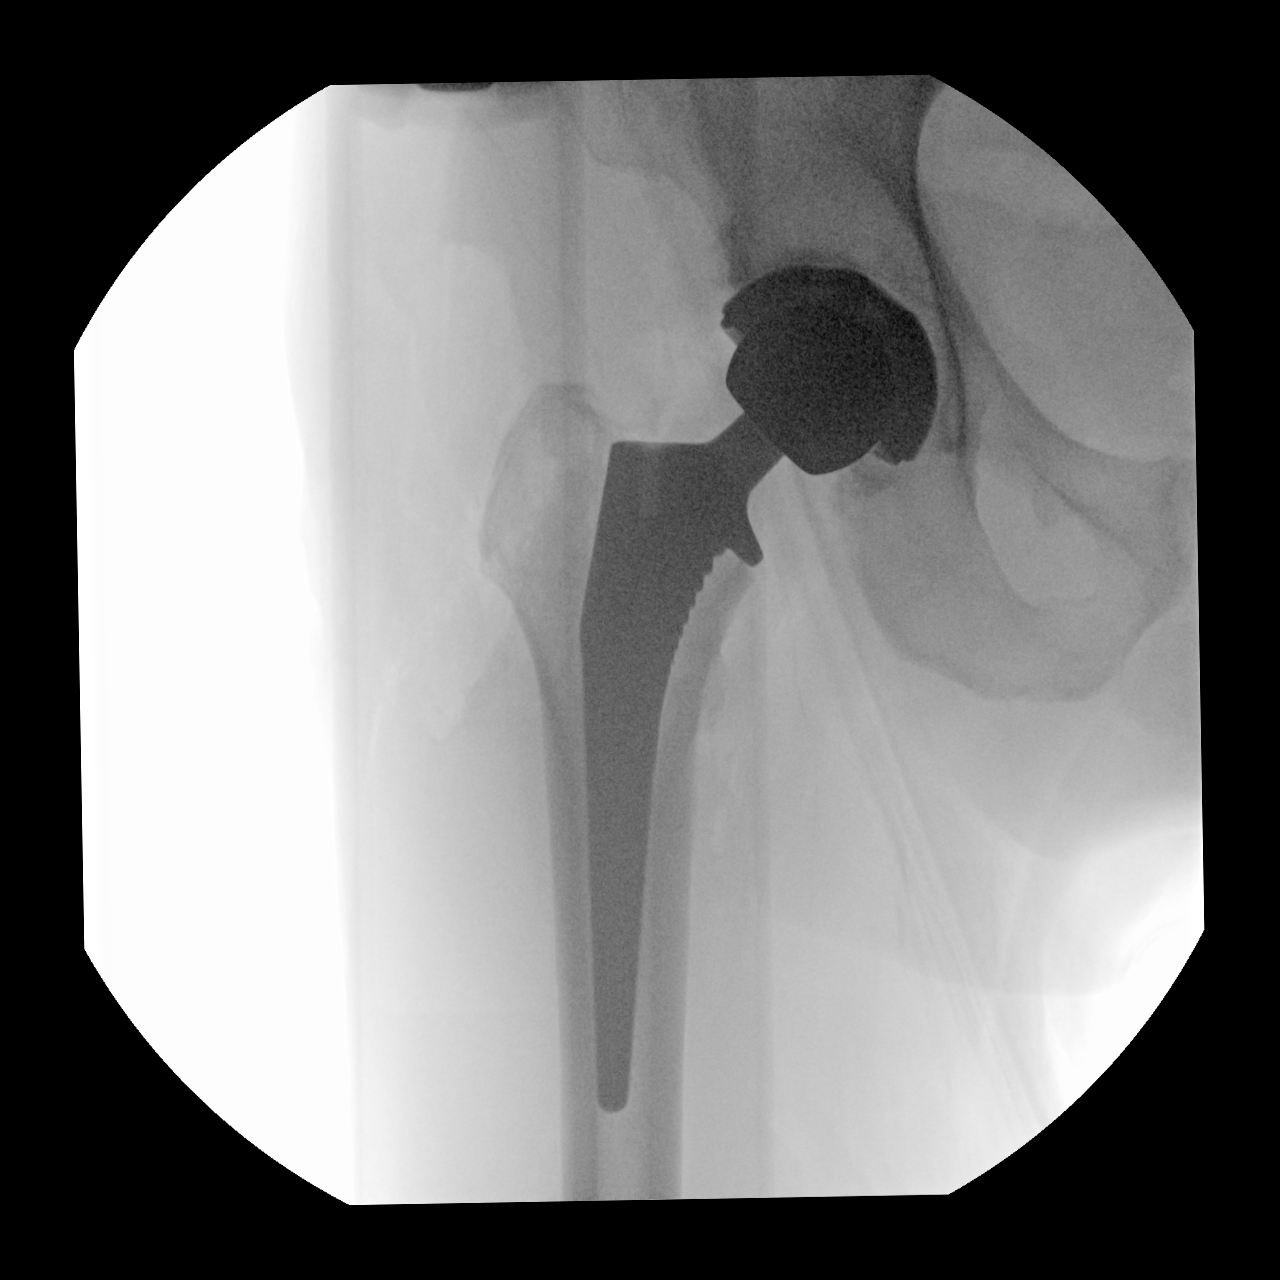

[2 of 2 positions shown; findings below may reference images not displayed]

FINDINGS: Status post right total hip replacement. No evidence for immediate
hardware complication.
IMPRESSION: No complicating features.
